# Patient Record
Sex: Male | Born: 1980 | Race: White | Hispanic: No | Marital: Single | Smoking: Never smoker
Health system: Southern US, Community
[De-identification: ages and names within clinical notes are randomized; demographics above are authoritative.]

## PROBLEM LIST (undated history)

## (undated) DIAGNOSIS — R0609 Other forms of dyspnea: Secondary | ICD-10-CM

## (undated) DIAGNOSIS — E785 Hyperlipidemia, unspecified: Secondary | ICD-10-CM

## (undated) DIAGNOSIS — R0600 Dyspnea, unspecified: Secondary | ICD-10-CM

## (undated) DIAGNOSIS — N3281 Overactive bladder: Secondary | ICD-10-CM

## (undated) DIAGNOSIS — I10 Essential (primary) hypertension: Secondary | ICD-10-CM

## (undated) DIAGNOSIS — K219 Gastro-esophageal reflux disease without esophagitis: Secondary | ICD-10-CM

## (undated) DIAGNOSIS — R0789 Other chest pain: Secondary | ICD-10-CM

## (undated) HISTORY — DX: Gastro-esophageal reflux disease without esophagitis: K21.9

## (undated) HISTORY — DX: Hyperlipidemia, unspecified: E78.5

## (undated) HISTORY — DX: Overactive bladder: N32.81

## (undated) HISTORY — DX: Dyspnea, unspecified: R06.00

## (undated) HISTORY — DX: Other chest pain: R07.89

## (undated) HISTORY — DX: Other forms of dyspnea: R06.09

## (undated) HISTORY — DX: Morbid (severe) obesity due to excess calories: E66.01

---

## 2008-05-23 ENCOUNTER — Emergency Department: Payer: Self-pay | Admitting: Emergency Medicine

## 2012-08-27 ENCOUNTER — Encounter: Payer: Self-pay | Admitting: Internal Medicine

## 2012-08-27 ENCOUNTER — Ambulatory Visit (INDEPENDENT_AMBULATORY_CARE_PROVIDER_SITE_OTHER): Payer: BC Managed Care – PPO | Admitting: Internal Medicine

## 2012-08-27 VITALS — BP 125/78 | HR 91 | Temp 98.3°F | Ht 72.0 in | Wt 273.2 lb

## 2012-08-27 DIAGNOSIS — R5381 Other malaise: Secondary | ICD-10-CM

## 2012-08-27 DIAGNOSIS — N318 Other neuromuscular dysfunction of bladder: Secondary | ICD-10-CM

## 2012-08-27 DIAGNOSIS — N3281 Overactive bladder: Secondary | ICD-10-CM

## 2012-08-27 DIAGNOSIS — R5383 Other fatigue: Secondary | ICD-10-CM

## 2012-08-27 DIAGNOSIS — Z139 Encounter for screening, unspecified: Secondary | ICD-10-CM

## 2012-08-29 ENCOUNTER — Encounter: Payer: Self-pay | Admitting: Internal Medicine

## 2012-08-29 NOTE — Assessment & Plan Note (Signed)
Not an issue for him now.  Resolved.

## 2012-08-29 NOTE — Progress Notes (Signed)
  Subjective:    Patient ID: Gary Fleming, male    DOB: October 16, 1980, 32 y.o.   MRN: [id number]  HPI 32 year old male with past history of overactive bladder.  He comes in today to establish care.  States he is doing well.  No cardiac symptoms with increased activity or exertion.  Breathing stable.  No acid reflux.  No nausea or vomiting.  No bowel change.  States the problem with overactive bladder resolved after being treated with two long courses of abx.  Not an issue for him now.  Exercises regularly.  Has not been exercising over the last month because he has been working two jobs.  Plans to start back exercising regularly now.    Past Medical History  Diagnosis Date  . Overactive bladder   . GERD (gastroesophageal reflux disease)     Review of Systems Patient denies any headache, lightheadedness or dizziness.  No sinus or allergy symptoms.  No chest pain, tightness or palpitations.  No increased shortness of breath, cough or congestion.  No nausea or vomiting.  No abdominal pain or cramping.  No bowel change, such as diarrhea, constipation, BRBPR or melana.  No urine change.  Overall he feels he is doing well.  Is sexually active.  Does not use protection.  Has one long term sexual partner.       Objective:   Physical Exam Filed Vitals:   08/27/12 1608  BP: 125/78  Pulse: 91  Temp: 98.3 F (106.91 C)   32 year old male in no acute distress.  HEENT:  Nares - clear.  Oropharynx - without lesions. NECK:  Supple.  Nontender.  No audible carotid bruit.  HEART:  Appears to be regular.   LUNGS:  No crackles or wheezing audible.  Respirations even and unlabored.   RADIAL PULSE:  Equal bilaterally.  ABDOMEN:  Soft.  Nontender.  Bowel sounds present and normal.  No audible abdominal bruit.  GU:  He declined.    RECTAL:  He declined.    EXTREMITIES:  No increased edema present.  DP pulses palpable and equal bilaterally.          Assessment & Plan:  FATIGUE.  Did report some fatigue, but  has been working two jobs.  Will check cbc, met c and tsh with next labs.    QUESTION OF BORDERLINE HYPERCHOLESTEROLEMIA.  Low cholesterol diet.  Check lipid panel with next labs.    HEALTH MAINTENANCE.  Physical today.  He declined GU exam.  States he has had multiple exams through urology.  Check cholesterol.

## 2012-10-29 ENCOUNTER — Other Ambulatory Visit: Payer: BC Managed Care – PPO

## 2012-11-02 ENCOUNTER — Other Ambulatory Visit: Payer: BC Managed Care – PPO

## 2013-08-03 ENCOUNTER — Telehealth: Payer: Self-pay | Admitting: Internal Medicine

## 2013-08-03 NOTE — Telephone Encounter (Signed)
Pt notified that we only have one physician in the office today. We could usually offer an appt with Raquel, NP however she is out of town this week. I recommended that patient go to Acute Care/ Urgent Care/ER & follow-up with [place] afterwards to make sure his sx's clear up.

## 2013-08-03 NOTE — Telephone Encounter (Signed)
Pt is calling and seeing if he could be seen for chest congestion and coughing a lot. May have bronchitis ?? There are no appoinment slots open ?? Offered this afternoon with Dr. Darrick Huntsman but he works a second job and could not do that.

## 2013-08-03 NOTE — Telephone Encounter (Signed)
Noted.  Let me know if we need to do something else.

## 2013-08-04 NOTE — Telephone Encounter (Signed)
Left message for pt to return my call, to confirm he was seen yesterday for his symptoms.

## 2013-08-16 NOTE — Telephone Encounter (Signed)
Pt never returned call

## 2013-09-02 ENCOUNTER — Encounter (INDEPENDENT_AMBULATORY_CARE_PROVIDER_SITE_OTHER): Payer: Self-pay

## 2013-09-02 ENCOUNTER — Encounter: Payer: Self-pay | Admitting: Internal Medicine

## 2013-09-02 ENCOUNTER — Ambulatory Visit (INDEPENDENT_AMBULATORY_CARE_PROVIDER_SITE_OTHER): Payer: BC Managed Care – PPO | Admitting: Internal Medicine

## 2013-09-02 VITALS — BP 120/82 | HR 93 | Temp 98.4°F | Ht 72.0 in | Wt 288.2 lb

## 2013-09-02 DIAGNOSIS — R198 Other specified symptoms and signs involving the digestive system and abdomen: Secondary | ICD-10-CM

## 2013-09-02 DIAGNOSIS — N3281 Overactive bladder: Secondary | ICD-10-CM

## 2013-09-02 DIAGNOSIS — N318 Other neuromuscular dysfunction of bladder: Secondary | ICD-10-CM

## 2013-09-02 MED ORDER — FLUTICASONE PROPIONATE 50 MCG/ACT NA SUSP: 2.0000 | Freq: Every day | NASAL | Status: DC

## 2013-09-02 NOTE — Patient Instructions (Signed)
Saline nasal flushes - flush nose at least 2-3x/day.  Flonase nasal spray - two sprays each nostril one time per day (do this in the evening).

## 2013-09-02 NOTE — Progress Notes (Signed)
  Subjective:    Patient ID: Gary Fleming, male    DOB: 1981-07-15, 33 y.o.   MRN: [id number]  HPI 33 year old male with past history of overactive bladder.  He comes in today for a complete physical exam.  States he is doing well.  No cardiac symptoms with increased activity or exertion.  Breathing stable.  No acid reflux.  No nausea or vomiting.  States the problem with overactive bladder resolved after being treated with two long courses of abx.  Not an issue for him now.  Does see urology yearly.  Exercises regularly.  Does report some oozing of his bowels.  Occurs after having a bowel movement.  Has control of his bowel, but will notice some oozing after completion of a bowel movement.     Past Medical History  Diagnosis Date  . Overactive bladder   . GERD (gastroesophageal reflux disease)     Current Outpatient Prescriptions on File Prior to Visit  Medication Sig Dispense Refill  . Multiple Vitamin (MULTIVITAMIN) tablet Take 1 tablet by mouth daily.       No current facility-administered medications on file prior to visit.    Review of Systems Patient denies any headache, lightheadedness or dizziness.  No sinus or allergy symptoms.  No chest pain, tightness or palpitations.  No increased shortness of breath, cough or congestion.  No nausea or vomiting.  No abdominal pain or cramping.  No bowel change, such as diarrhea, constipation, BRBPR or melana.  Does report the oozing as outlined.  No urine change.  Overall he feels he is doing well.  Has tried fiber supplements and a probiotic.  No improvement in the oozing.       Objective:   Physical Exam  Filed Vitals:   09/02/13 1535  BP: 120/82  Pulse: 93  Temp: 98.4 F (9236.489 C)   33 year old male in no acute distress.  HEENT:  Nares - clear.  Oropharynx - without lesions. NECK:  Supple.  Nontender.  No audible carotid bruit.  HEART:  Appears to be regular.   LUNGS:  No crackles or wheezing audible.  Respirations even and  unlabored.   RADIAL PULSE:  Equal bilaterally.  ABDOMEN:  Soft.  Nontender.  Bowel sounds present and normal.  No audible abdominal bruit.   GU:  Normal sphincter tone.  Heme negative.   EXTREMITIES:  No increased edema present.  DP pulses palpable and equal bilaterally.          Assessment & Plan:  QUESTION OF BORDERLINE HYPERCHOLESTEROLEMIA.  Low cholesterol diet.  Check lipid panel with next labs.    HEALTH MAINTENANCE.  Physical today.   Check cholesterol.

## 2013-09-02 NOTE — Progress Notes (Signed)
Pre-visit discussion using our clinic review tool. No additional management support is needed unless otherwise documented below in the visit note.  

## 2013-09-05 ENCOUNTER — Encounter: Payer: Self-pay | Admitting: Internal Medicine

## 2013-09-05 ENCOUNTER — Encounter: Payer: Self-pay | Admitting: Emergency Medicine

## 2013-09-05 DIAGNOSIS — R197 Diarrhea, unspecified: Secondary | ICD-10-CM | POA: Insufficient documentation

## 2013-09-05 NOTE — Assessment & Plan Note (Signed)
Some oozing after a bowel movement as outlined.  Has had colonoscopy two years ago.  Normal sphincter tone on exam.  Has tried fiber and a probiotic.  No change.  Will refer to GI given change in bowels - present for one year now.

## 2013-09-05 NOTE — Assessment & Plan Note (Signed)
Not an issue for him now.  Resolved.  Sees urology.

## 2013-09-13 ENCOUNTER — Telehealth: Payer: Self-pay | Admitting: *Deleted

## 2013-09-13 DIAGNOSIS — N3281 Overactive bladder: Secondary | ICD-10-CM

## 2013-09-13 DIAGNOSIS — Z13220 Encounter for screening for lipoid disorders: Secondary | ICD-10-CM

## 2013-09-13 DIAGNOSIS — R198 Other specified symptoms and signs involving the digestive system and abdomen: Secondary | ICD-10-CM

## 2013-09-13 NOTE — Telephone Encounter (Signed)
Orders placed for labs

## 2013-09-13 NOTE — Telephone Encounter (Signed)
Pt is coming in for labs tomorrow 01.21.2015 what labs and dx?  

## 2013-09-14 ENCOUNTER — Other Ambulatory Visit: Payer: Self-pay | Admitting: Internal Medicine

## 2013-09-14 ENCOUNTER — Other Ambulatory Visit (INDEPENDENT_AMBULATORY_CARE_PROVIDER_SITE_OTHER): Payer: BC Managed Care – PPO

## 2013-09-14 DIAGNOSIS — Z13220 Encounter for screening for lipoid disorders: Secondary | ICD-10-CM

## 2013-09-14 DIAGNOSIS — E7800 Pure hypercholesterolemia, unspecified: Secondary | ICD-10-CM

## 2013-09-14 DIAGNOSIS — R198 Other specified symptoms and signs involving the digestive system and abdomen: Secondary | ICD-10-CM

## 2013-09-14 LAB — COMPREHENSIVE METABOLIC PANEL
ALBUMIN: 4.1 g/dL (ref 3.5–5.2)
ALT: 34 U/L (ref 0–53)
AST: 27 U/L (ref 0–37)
Alkaline Phosphatase: 80 U/L (ref 39–117)
BUN: 12 mg/dL (ref 6–23)
CO2: 29 mEq/L (ref 19–32)
Calcium: 9 mg/dL (ref 8.4–10.5)
Chloride: 104 mEq/L (ref 96–112)
Creatinine, Ser: 1 mg/dL (ref 0.4–1.5)
GFR: 88.77 mL/min (ref >60.00)
Glucose, Bld: 78 mg/dL (ref 70–99)
POTASSIUM: 3.8 meq/L (ref 3.5–5.1)
Sodium: 138 mEq/L (ref 135–145)
Total Bilirubin: 0.9 mg/dL (ref 0.3–1.2)
Total Protein: 7.4 g/dL (ref 6.0–8.3)

## 2013-09-14 LAB — CBC WITH DIFFERENTIAL/PLATELET
Basophils Absolute: 0 10*3/uL (ref 0.0–0.1)
Basophils Relative: 0.3 % (ref 0.0–3.0)
Eosinophils Absolute: 0.4 10*3/uL (ref 0.0–0.7)
Eosinophils Relative: 3.5 % (ref 0.0–5.0)
HCT: 42.4 % (ref 39.0–52.0)
Hemoglobin: 14.5 g/dL (ref 13.0–17.0)
Lymphocytes Relative: 27 % (ref 12.0–46.0)
Lymphs Abs: 2.8 10*3/uL (ref 0.7–4.0)
MCHC: 34.2 g/dL (ref 30.0–36.0)
MCV: 84.1 fl (ref 78.0–100.0)
MONO ABS: 0.8 10*3/uL (ref 0.1–1.0)
Monocytes Relative: 7.3 % (ref 3.0–12.0)
Neutro Abs: 6.4 10*3/uL (ref 1.4–7.7)
Neutrophils Relative %: 61.9 % (ref 43.0–77.0)
Platelets: 380 10*3/uL (ref 150.0–400.0)
RBC: 5.04 Mil/uL (ref 4.22–5.81)
RDW: 13.4 % (ref 11.5–14.6)
WBC: 10.3 10*3/uL (ref 4.5–10.5)

## 2013-09-14 LAB — LIPID PANEL
CHOL/HDL RATIO: 5
Cholesterol: 215 mg/dL — ABNORMAL HIGH (ref 0–200)
HDL: 39.5 mg/dL (ref >39.00)
Triglycerides: 115 mg/dL (ref 0.0–149.0)
VLDL: 23 mg/dL (ref 0.0–40.0)

## 2013-09-14 LAB — TSH: TSH: 1.35 u[IU]/mL (ref 0.35–5.50)

## 2013-09-14 LAB — LDL CHOLESTEROL, DIRECT: Direct LDL: 161 mg/dL

## 2013-09-14 NOTE — Progress Notes (Signed)
Order placed for cholesterol check.  

## 2013-09-15 ENCOUNTER — Encounter: Payer: Self-pay | Admitting: *Deleted

## 2014-03-01 ENCOUNTER — Other Ambulatory Visit (INDEPENDENT_AMBULATORY_CARE_PROVIDER_SITE_OTHER): Payer: BC Managed Care – PPO

## 2014-03-01 ENCOUNTER — Encounter: Payer: Self-pay | Admitting: Internal Medicine

## 2014-03-01 DIAGNOSIS — E7800 Pure hypercholesterolemia, unspecified: Secondary | ICD-10-CM

## 2014-03-01 LAB — LIPID PANEL
Cholesterol: 237 mg/dL — ABNORMAL HIGH (ref 0–200)
HDL: 41.4 mg/dL (ref >39.00)
LDL Cholesterol: 177 mg/dL — ABNORMAL HIGH (ref 0–99)
NonHDL: 195.6
Total CHOL/HDL Ratio: 6
Triglycerides: 94 mg/dL (ref 0.0–149.0)
VLDL: 18.8 mg/dL (ref 0.0–40.0)

## 2014-03-02 ENCOUNTER — Other Ambulatory Visit: Payer: Self-pay | Admitting: Internal Medicine

## 2014-03-02 ENCOUNTER — Other Ambulatory Visit (INDEPENDENT_AMBULATORY_CARE_PROVIDER_SITE_OTHER): Payer: BC Managed Care – PPO

## 2014-03-02 DIAGNOSIS — E7800 Pure hypercholesterolemia, unspecified: Secondary | ICD-10-CM

## 2014-03-02 LAB — HEPATIC FUNCTION PANEL
ALBUMIN: 4.4 g/dL (ref 3.5–5.2)
ALK PHOS: 78 U/L (ref 39–117)
ALT: 40 U/L (ref 0–53)
AST: 28 U/L (ref 0–37)
Bilirubin, Direct: 0.1 mg/dL (ref 0.0–0.3)
TOTAL PROTEIN: 7.5 g/dL (ref 6.0–8.3)
Total Bilirubin: 0.9 mg/dL (ref 0.2–1.2)

## 2014-03-02 NOTE — Progress Notes (Signed)
Order placed for liver panel.  

## 2014-03-03 ENCOUNTER — Ambulatory Visit (INDEPENDENT_AMBULATORY_CARE_PROVIDER_SITE_OTHER): Payer: BC Managed Care – PPO | Admitting: Internal Medicine

## 2014-03-03 ENCOUNTER — Encounter: Payer: Self-pay | Admitting: Internal Medicine

## 2014-03-03 VITALS — BP 130/80 | HR 79 | Temp 98.4°F | Wt 283.0 lb

## 2014-03-03 DIAGNOSIS — E7800 Pure hypercholesterolemia, unspecified: Secondary | ICD-10-CM

## 2014-03-03 DIAGNOSIS — L989 Disorder of the skin and subcutaneous tissue, unspecified: Secondary | ICD-10-CM

## 2014-03-03 DIAGNOSIS — N3281 Overactive bladder: Secondary | ICD-10-CM

## 2014-03-03 DIAGNOSIS — R198 Other specified symptoms and signs involving the digestive system and abdomen: Secondary | ICD-10-CM

## 2014-03-03 DIAGNOSIS — N318 Other neuromuscular dysfunction of bladder: Secondary | ICD-10-CM

## 2014-03-03 NOTE — Progress Notes (Signed)
Pre visit review using our clinic review tool, if applicable. No additional management support is needed unless otherwise documented below in the visit note. 

## 2014-03-09 ENCOUNTER — Encounter: Payer: Self-pay | Admitting: Internal Medicine

## 2014-03-09 DIAGNOSIS — E7800 Pure hypercholesterolemia, unspecified: Secondary | ICD-10-CM | POA: Insufficient documentation

## 2014-03-09 DIAGNOSIS — L989 Disorder of the skin and subcutaneous tissue, unspecified: Secondary | ICD-10-CM | POA: Insufficient documentation

## 2014-03-09 NOTE — Assessment & Plan Note (Signed)
Saw GI.  Diagnosed with IBS with constipation.  Felt no further w/up warranted.  He desires no further intervention.

## 2014-03-09 NOTE — Assessment & Plan Note (Signed)
Not an issue for him now.  Resolved.  Sees urology.

## 2014-03-09 NOTE — Assessment & Plan Note (Signed)
Persistent lesion - abdomen.  Refer to dermatology for evaluation.

## 2014-03-09 NOTE — Assessment & Plan Note (Signed)
Cholesterol just checked and elevated (LDL 177).  Discussed medication.  He declines.  Wants to work on diet and exercise.  Follow.

## 2014-03-09 NOTE — Progress Notes (Signed)
  Subjective:    Patient ID: Gary Fleming, male    DOB: 12/20/1980, 33 y.o.   MRN: [id number]  HPI 33 year old male with past history of overactive bladder.  He comes in today for a scheduled follow up.  States he is doing well.  No cardiac symptoms with increased activity or exertion.  Breathing stable.  No acid reflux.  No nausea or vomiting.  States the problem with overactive bladder resolved after being treated with two long courses of abx.  Not an issue for him now.  Does see urology yearly.  Exercises regularly.  Had reported some oozing of his bowels.  See last note for details.  Saw GI.  No further w/up thought warranted.  Told him had IBS with constipation.  He states he is managing.  Desires no further intervention.  Does have a lesion on his abdomen.  Persistent.     Past Medical History  Diagnosis Date  . Overactive bladder   . GERD (gastroesophageal reflux disease)     Current Outpatient Prescriptions on File Prior to Visit  Medication Sig Dispense Refill  . fluticasone (FLONASE) 50 MCG/ACT nasal spray Place 2 sprays into both nostrils daily.  16 g  2  . Multiple Vitamin (MULTIVITAMIN) tablet Take 1 tablet by mouth daily.       No current facility-administered medications on file prior to visit.    Review of Systems Patient denies any headache, lightheadedness or dizziness.  No sinus or allergy symptoms.  No chest pain, tightness or palpitations.  No increased shortness of breath, cough or congestion.  No nausea or vomiting.  No abdominal pain or cramping.  No bowel change, such as diarrhea, constipation, BRBPR or melana.  Does report the oozing as outlined.  Saw GI.  See above.  No urine change.  Overall he feels he is doing well.       Objective:   Physical Exam  Filed Vitals:   03/03/14 1622  BP: 130/80  Pulse: 79  Temp: 98.4 F (9036.759 C)   33 year old male in no acute distress.  HEENT:  Nares - clear.  Oropharynx - without lesions. NECK:  Supple.  Nontender.  No  audible carotid bruit.  HEART:  Appears to be regular.   LUNGS:  No crackles or wheezing audible.  Respirations even and unlabored.   RADIAL PULSE:  Equal bilaterally.  ABDOMEN:  Soft.  Nontender.  Bowel sounds present and normal.  No audible abdominal bruit.  EXTREMITIES:  No increased edema present.  DP pulses palpable and equal bilaterally.          Assessment & Plan:  HEALTH MAINTENANCE.  Physical last visit (09/02/13).   Sees urology yearly.

## 2014-09-01 ENCOUNTER — Other Ambulatory Visit: Payer: BC Managed Care – PPO

## 2014-09-04 ENCOUNTER — Encounter: Payer: BC Managed Care – PPO | Admitting: Internal Medicine

## 2014-11-06 ENCOUNTER — Other Ambulatory Visit (INDEPENDENT_AMBULATORY_CARE_PROVIDER_SITE_OTHER): Payer: BC Managed Care – PPO

## 2014-11-06 DIAGNOSIS — E7800 Pure hypercholesterolemia, unspecified: Secondary | ICD-10-CM

## 2014-11-06 DIAGNOSIS — R194 Change in bowel habit: Secondary | ICD-10-CM

## 2014-11-06 DIAGNOSIS — R198 Other specified symptoms and signs involving the digestive system and abdomen: Secondary | ICD-10-CM

## 2014-11-06 LAB — COMPREHENSIVE METABOLIC PANEL
ALBUMIN: 4.6 g/dL (ref 3.5–5.2)
ALT: 32 U/L (ref 0–53)
AST: 25 U/L (ref 0–37)
Alkaline Phosphatase: 91 U/L (ref 39–117)
BUN: 10 mg/dL (ref 6–23)
CALCIUM: 9.4 mg/dL (ref 8.4–10.5)
CO2: 30 meq/L (ref 19–32)
Chloride: 101 mEq/L (ref 96–112)
Creatinine, Ser: 0.92 mg/dL (ref 0.40–1.50)
GFR: 100.41 mL/min (ref >60.00)
GLUCOSE: 80 mg/dL (ref 70–99)
Potassium: 3.8 mEq/L (ref 3.5–5.1)
Sodium: 139 mEq/L (ref 135–145)
Total Bilirubin: 0.4 mg/dL (ref 0.2–1.2)
Total Protein: 7.6 g/dL (ref 6.0–8.3)

## 2014-11-06 LAB — LIPID PANEL
CHOLESTEROL: 222 mg/dL — AB (ref 0–200)
HDL: 44.1 mg/dL (ref >39.00)
NonHDL: 177.9
Total CHOL/HDL Ratio: 5
Triglycerides: 207 mg/dL — ABNORMAL HIGH (ref 0.0–149.0)
VLDL: 41.4 mg/dL — ABNORMAL HIGH (ref 0.0–40.0)

## 2014-11-06 LAB — CBC WITH DIFFERENTIAL/PLATELET
BASOS ABS: 0.1 10*3/uL (ref 0.0–0.1)
Basophils Relative: 0.6 % (ref 0.0–3.0)
EOS ABS: 0.6 10*3/uL (ref 0.0–0.7)
Eosinophils Relative: 6 % — ABNORMAL HIGH (ref 0.0–5.0)
HCT: 45.8 % (ref 39.0–52.0)
HEMOGLOBIN: 15.6 g/dL (ref 13.0–17.0)
LYMPHS PCT: 34.4 % (ref 12.0–46.0)
Lymphs Abs: 3.2 10*3/uL (ref 0.7–4.0)
MCHC: 34 g/dL (ref 30.0–36.0)
MCV: 84.4 fl (ref 78.0–100.0)
MONOS PCT: 7.5 % (ref 3.0–12.0)
Monocytes Absolute: 0.7 10*3/uL (ref 0.1–1.0)
NEUTROS PCT: 51.5 % (ref 43.0–77.0)
Neutro Abs: 4.8 10*3/uL (ref 1.4–7.7)
Platelets: 365 10*3/uL (ref 150.0–400.0)
RBC: 5.42 Mil/uL (ref 4.22–5.81)
RDW: 13.1 % (ref 11.5–15.5)
WBC: 9.3 10*3/uL (ref 4.0–10.5)

## 2014-11-06 LAB — LDL CHOLESTEROL, DIRECT: Direct LDL: 159 mg/dL

## 2014-11-06 LAB — TSH: TSH: 3.08 u[IU]/mL (ref 0.35–4.50)

## 2014-11-07 ENCOUNTER — Encounter: Payer: Self-pay | Admitting: Internal Medicine

## 2014-11-09 ENCOUNTER — Encounter: Payer: BC Managed Care – PPO | Admitting: Internal Medicine

## 2014-12-19 ENCOUNTER — Other Ambulatory Visit (HOSPITAL_COMMUNITY)
Admission: RE | Admit: 2014-12-19 | Discharge: 2014-12-19 | Disposition: A | Discharge status: Home / Self Care | Payer: BC Managed Care – PPO | Source: Ambulatory Visit | Attending: Internal Medicine | Admitting: Internal Medicine

## 2014-12-19 ENCOUNTER — Encounter: Payer: Self-pay | Admitting: Internal Medicine

## 2014-12-19 ENCOUNTER — Ambulatory Visit (INDEPENDENT_AMBULATORY_CARE_PROVIDER_SITE_OTHER): Payer: BC Managed Care – PPO | Admitting: Internal Medicine

## 2014-12-19 VITALS — BP 138/78 | HR 105 | Temp 98.2°F | Ht 71.5 in | Wt 283.5 lb

## 2014-12-19 DIAGNOSIS — Z202 Contact with and (suspected) exposure to infections with a predominantly sexual mode of transmission: Secondary | ICD-10-CM | POA: Diagnosis not present

## 2014-12-19 DIAGNOSIS — Z113 Encounter for screening for infections with a predominantly sexual mode of transmission: Secondary | ICD-10-CM | POA: Diagnosis not present

## 2014-12-19 DIAGNOSIS — Z0000 Encounter for general adult medical examination without abnormal findings: Secondary | ICD-10-CM | POA: Diagnosis not present

## 2014-12-19 DIAGNOSIS — N3281 Overactive bladder: Secondary | ICD-10-CM

## 2014-12-19 DIAGNOSIS — E7800 Pure hypercholesterolemia, unspecified: Secondary | ICD-10-CM

## 2014-12-19 DIAGNOSIS — G479 Sleep disorder, unspecified: Secondary | ICD-10-CM | POA: Diagnosis not present

## 2014-12-19 DIAGNOSIS — Z711 Person with feared health complaint in whom no diagnosis is made: Secondary | ICD-10-CM

## 2014-12-19 MED ORDER — TRAZODONE HCL 50 MG PO TABS: 25.0000 mg | ORAL_TABLET | Freq: Every evening | ORAL | Status: DC | PRN

## 2014-12-19 MED ORDER — ATORVASTATIN CALCIUM 10 MG PO TABS: 10.0000 mg | ORAL_TABLET | Freq: Every day | ORAL | Status: DC

## 2014-12-19 NOTE — Progress Notes (Signed)
Patient ID: Gary Fleming, male   DOB: 1980/12/02, 34 y.o.   MRN: [id number]   Subjective:    Patient ID: Gary Fleming, male    DOB: 1980/12/02, 34 y.o.   MRN: [id number]  HPI  Patient here for a physical exam.  Has seen urology.  Had prostate check through there office.  On lipitor.  Discussed diet and exercise.  Tries to stay active.  No cardiac symptoms with increased activity or exertion.  Breathing stable.  Has trouble sleeping.  Has tried otc sleep aids and melatonin.  Would like to try prescription medication.  Also had unprotected sex recently.  Wants checked for STDs.  No symptoms.  No dysuria.  No discharge.     Past Medical History  Diagnosis Date  . Overactive bladder   . GERD (gastroesophageal reflux disease)     Review of Systems  Constitutional: Negative for appetite change and unexpected weight change.  HENT: Negative for congestion and sinus pressure.   Eyes: Negative for pain and visual disturbance.  Respiratory: Negative for cough, chest tightness and shortness of breath.   Cardiovascular: Negative for chest pain, palpitations and leg swelling.  Gastrointestinal: Negative for nausea, vomiting, abdominal pain and diarrhea.  Genitourinary: Negative for dysuria and difficulty urinating.  Musculoskeletal: Negative for back pain and joint swelling.  Skin: Negative for color change and rash.  Neurological: Negative for dizziness, light-headedness and headaches.  Hematological: Negative for adenopathy. Does not bruise/bleed easily.  Psychiatric/Behavioral: Negative for dysphoric mood and agitation.       Objective:     Blood pressure recheck:  128/78, pulse 84  Physical Exam  Constitutional: He is oriented to person, place, and time. He appears well-developed and well-nourished. No distress.  HENT:  Head: Normocephalic and atraumatic.  Nose: Nose normal.  Mouth/Throat: Oropharynx is clear and moist. No oropharyngeal exudate.  Eyes: Conjunctivae are normal.  Right eye exhibits no discharge. Left eye exhibits no discharge.  Neck: Neck supple. No thyromegaly present.  Cardiovascular: Normal rate and regular rhythm.   Pulmonary/Chest: Breath sounds normal. No respiratory distress. He has no wheezes.  Abdominal: Soft. Bowel sounds are normal. There is no tenderness.  Genitourinary:  Performed by urology  Musculoskeletal: He exhibits no edema or tenderness.  Lymphadenopathy:    He has no cervical adenopathy.  Neurological: He is alert and oriented to person, place, and time.  Skin: Skin is warm and dry. No rash noted.  Psychiatric: He has a normal mood and affect. His behavior is normal.    BP 138/78 mmHg  Pulse 105  Temp(Src) 98.2 F (36.8 C) (Oral)  Ht 5' 11.5" (1.816 m)  Wt 283 lb 8 oz (128.595 kg)  BMI 38.99 kg/m2  SpO2 99% Wt Readings from Last 3 Encounters:  12/19/14 283 lb 8 oz (128.595 kg)  03/03/14 283 lb (128.368 kg)  09/02/13 288 lb 4 oz (130.749 kg)     Lab Results  Component Value Date   WBC 9.3 11/06/2014   HGB 15.6 11/06/2014   HCT 45.8 11/06/2014   PLT 365.0 11/06/2014   GLUCOSE 80 11/06/2014   CHOL 222* 11/06/2014   TRIG 207.0* 11/06/2014   HDL 44.10 11/06/2014   LDLDIRECT 159.0 11/06/2014   LDLCALC 177* 03/01/2014   ALT 32 11/06/2014   AST 25 11/06/2014   NA 139 11/06/2014   K 3.8 11/06/2014   CL 101 11/06/2014   CREATININE 0.92 11/06/2014   BUN 10 11/06/2014   CO2 30 11/06/2014   TSH  3.08 11/06/2014       Assessment & Plan:   Problem List Items Addressed This Visit    Difficulty sleeping    Has tried otc sleep aids.  Tried melatonin.  Discussed at length with him today.  No history of sleep apnea.  No increased daytime somnolence.  Does feel rested if gets a good nights sleep.  Start trazodone as directed.  Follow.        Health care maintenance    Physical 12/19/14.  Sees urology for prostate checks.        Hypercholesterolemia    Low cholesterol diet and exercise.  On lipitor.  Follow lipid  apnela dn liver function tests.        Relevant Medications   atorvastatin (LIPITOR) 10 MG tablet   Overactive bladder    Sees urology.  No problems reported.        STD exposure    Discussed with pt at length.  Had unprotected sex.  Check STD panel, GC, chlamydia, trichomonas and HSV.         Other Visit Diagnoses    Concern about STD in male without diagnosis    -  Primary    Relevant Orders    STD Panel (HBSAG,HIV,RPR) (Completed)    HSV 1 antibody, IgG (Completed)    Urine cytology ancillary only (Completed)      I spent 25 minutes with the patient and more than 50% of the time was spent in consultation regarding the above.     Dale [place], MD

## 2014-12-19 NOTE — Progress Notes (Signed)
Pre visit review using our clinic review tool, if applicable. No additional management support is needed unless otherwise documented below in the visit note. 

## 2014-12-20 ENCOUNTER — Encounter: Payer: Self-pay | Admitting: Internal Medicine

## 2014-12-20 LAB — STD PANEL
HIV 1&2 Ab, 4th Generation: NONREACTIVE
Hepatitis B Surface Ag: NEGATIVE

## 2014-12-20 LAB — HSV 1 ANTIBODY, IGG: HSV 1 Glycoprotein G Ab, IgG: <0.1 IV

## 2014-12-21 ENCOUNTER — Encounter: Payer: Self-pay | Admitting: Internal Medicine

## 2014-12-21 LAB — URINE CYTOLOGY ANCILLARY ONLY
Chlamydia: NEGATIVE
Neisseria Gonorrhea: NEGATIVE
Trichomonas: NEGATIVE

## 2014-12-26 ENCOUNTER — Encounter: Payer: Self-pay | Admitting: Internal Medicine

## 2014-12-26 DIAGNOSIS — Z0000 Encounter for general adult medical examination without abnormal findings: Secondary | ICD-10-CM | POA: Insufficient documentation

## 2014-12-26 DIAGNOSIS — G479 Sleep disorder, unspecified: Secondary | ICD-10-CM | POA: Insufficient documentation

## 2014-12-26 DIAGNOSIS — Z202 Contact with and (suspected) exposure to infections with a predominantly sexual mode of transmission: Secondary | ICD-10-CM | POA: Insufficient documentation

## 2014-12-26 NOTE — Assessment & Plan Note (Signed)
Discussed with pt at length.  Had unprotected sex.  Check STD panel, GC, chlamydia, trichomonas and HSV.

## 2014-12-26 NOTE — Assessment & Plan Note (Signed)
Physical 12/19/14.  Sees urology for prostate checks.   

## 2014-12-26 NOTE — Assessment & Plan Note (Signed)
Low cholesterol diet and exercise.  On lipitor.  Follow lipid apnela dn liver function tests.

## 2014-12-26 NOTE — Assessment & Plan Note (Signed)
Has tried otc sleep aids.  Tried melatonin.  Discussed at length with him today.  No history of sleep apnea.  No increased daytime somnolence.  Does feel rested if gets a good nights sleep.  Start trazodone as directed.  Follow.

## 2014-12-26 NOTE — Assessment & Plan Note (Signed)
Sees urology.  No problems reported.   

## 2015-01-30 ENCOUNTER — Other Ambulatory Visit: Payer: BC Managed Care – PPO

## 2015-02-20 ENCOUNTER — Other Ambulatory Visit (INDEPENDENT_AMBULATORY_CARE_PROVIDER_SITE_OTHER): Payer: BC Managed Care – PPO

## 2015-02-20 ENCOUNTER — Encounter: Payer: Self-pay | Admitting: Internal Medicine

## 2015-02-20 ENCOUNTER — Telehealth: Payer: Self-pay | Admitting: *Deleted

## 2015-02-20 DIAGNOSIS — E7800 Pure hypercholesterolemia, unspecified: Secondary | ICD-10-CM

## 2015-02-20 LAB — HEPATIC FUNCTION PANEL
ALT: 31 U/L (ref 0–53)
AST: 23 U/L (ref 0–37)
Albumin: 4.4 g/dL (ref 3.5–5.2)
Alkaline Phosphatase: 86 U/L (ref 39–117)
Bilirubin, Direct: 0.2 mg/dL (ref 0.0–0.3)
Total Bilirubin: 0.8 mg/dL (ref 0.2–1.2)
Total Protein: 7.1 g/dL (ref 6.0–8.3)

## 2015-02-20 NOTE — Telephone Encounter (Signed)
Order placed for labs.

## 2015-02-20 NOTE — Telephone Encounter (Signed)
Labs and dx?  

## 2015-03-07 ENCOUNTER — Other Ambulatory Visit: Payer: Self-pay | Admitting: *Deleted

## 2015-03-07 MED ORDER — ATORVASTATIN CALCIUM 10 MG PO TABS: 10.0000 mg | ORAL_TABLET | Freq: Every day | ORAL | Status: DC

## 2015-03-13 ENCOUNTER — Ambulatory Visit (INDEPENDENT_AMBULATORY_CARE_PROVIDER_SITE_OTHER): Payer: BC Managed Care – PPO | Admitting: Internal Medicine

## 2015-03-13 ENCOUNTER — Encounter: Payer: Self-pay | Admitting: Internal Medicine

## 2015-03-13 VITALS — BP 134/85 | HR 87 | Temp 98.3°F | Resp 18 | Ht 71.5 in | Wt 291.2 lb

## 2015-03-13 DIAGNOSIS — Z0000 Encounter for general adult medical examination without abnormal findings: Secondary | ICD-10-CM | POA: Diagnosis not present

## 2015-03-13 DIAGNOSIS — N3281 Overactive bladder: Secondary | ICD-10-CM | POA: Diagnosis not present

## 2015-03-13 DIAGNOSIS — E7800 Pure hypercholesterolemia, unspecified: Secondary | ICD-10-CM

## 2015-03-13 DIAGNOSIS — R194 Change in bowel habit: Secondary | ICD-10-CM

## 2015-03-13 DIAGNOSIS — G479 Sleep disorder, unspecified: Secondary | ICD-10-CM

## 2015-03-13 DIAGNOSIS — R198 Other specified symptoms and signs involving the digestive system and abdomen: Secondary | ICD-10-CM

## 2015-03-13 NOTE — Progress Notes (Signed)
Patient ID: Gary MacMatthew Y Basinski, male   DOB: [birth year], 34 y.o.   MRN: [id number]   Subjective:    Patient ID: Gary Fleming, male    DOB: [birth year], 34 y.o.   MRN: [id number]  HPI  Patient here for a scheduled follow up.  Reports not taking the trazodone now.  Took only for a few nights.  States he slept for a couple of hours and then woke up.  We discussed the possibility of taking an increased dose.  He is taking an otc sleep aid.  States this is working well for him and he desires not to change.  No breathing difficulty.  Does feel rested if he sleeps well.  No increased daytime somnolence.  We discussed diet and exercise.  Bowels stable.  No urinary symptoms.     Past Medical History  Diagnosis Date  . Overactive bladder   . GERD (gastroesophageal reflux disease)     Outpatient Encounter Prescriptions as of 03/13/2015  Medication Sig  . atorvastatin (LIPITOR) 10 MG tablet Take 1 tablet (10 mg total) by mouth daily.  . fluticasone (FLONASE) 50 MCG/ACT nasal spray Place 2 sprays into both nostrils daily.  . Multiple Vitamin (MULTIVITAMIN) tablet Take 1 tablet by mouth daily.  . traZODone (DESYREL) 50 MG tablet Take 0.5-1 tablets (25-50 mg total) by mouth at bedtime as needed for sleep. (Patient not taking: Reported on 03/13/2015)   No facility-administered encounter medications on file as of 03/13/2015.    Review of Systems  Constitutional: Negative for appetite change and unexpected weight change.  HENT: Negative for congestion and sinus pressure.   Respiratory: Negative for cough, chest tightness and shortness of breath.   Cardiovascular: Negative for chest pain, palpitations and leg swelling.  Gastrointestinal: Negative for nausea, vomiting, abdominal pain and diarrhea.  Genitourinary: Negative for dysuria and difficulty urinating.  Skin: Negative for color change and rash.  Neurological: Negative for dizziness, light-headedness and headaches.  Psychiatric/Behavioral: Negative  for dysphoric mood and agitation.       Objective:     Blood pressure recheck:  128/84, pulse 84  Physical Exam  Constitutional: He appears well-developed and well-nourished. No distress.  HENT:  Nose: Nose normal.  Mouth/Throat: Oropharynx is clear and moist.  Neck: Neck supple. No thyromegaly present.  Cardiovascular: Normal rate and regular rhythm.   Pulmonary/Chest: Effort normal and breath sounds normal. No respiratory distress.  Abdominal: Soft. Bowel sounds are normal. There is no tenderness.  Musculoskeletal: He exhibits no edema or tenderness.  Lymphadenopathy:    He has no cervical adenopathy.  Skin: No rash noted. No erythema.  Psychiatric: He has a normal mood and affect. His behavior is normal.    BP 134/85 mmHg  Pulse 87  Temp(Src) 98.3 F (36.8 C) (Oral)  Resp 18  Ht 5' 11.5" (1.816 m)  Wt 291 lb 4 oz (132.11 kg)  BMI 40.06 kg/m2  SpO2 99% Wt Readings from Last 3 Encounters:  03/13/15 291 lb 4 oz (132.11 kg)  12/19/14 283 lb 8 oz (128.595 kg)  03/03/14 283 lb (128.368 kg)     Lab Results  Component Value Date   WBC 9.3 11/06/2014   HGB 15.6 11/06/2014   HCT 45.8 11/06/2014   PLT 365.0 11/06/2014   GLUCOSE 80 11/06/2014   CHOL 222* 11/06/2014   TRIG 207.0* 11/06/2014   HDL 44.10 11/06/2014   LDLDIRECT 159.0 11/06/2014   LDLCALC 177* 03/01/2014   ALT 31 02/20/2015   AST 23 02/20/2015  NA 139 11/06/2014   K 3.8 11/06/2014   CL 101 11/06/2014   CREATININE 0.92 11/06/2014   BUN 10 11/06/2014   CO2 30 11/06/2014   TSH 3.08 11/06/2014       Assessment & Plan:   Problem List Items Addressed This Visit    Change in bowel function    Not an issue now.  Saw GI.  Diagnosed with IBS with constipation.        Difficulty sleeping    Doing better now.  Desires no further intervention.        Health care maintenance    Physical 12/19/14.  Sees urology for prostate checks.        Hypercholesterolemia    Low cholesterol diet and exercise.   On lipitor.  Follow lipid panel and liver function tests.        Relevant Orders   Lipid panel   Basic metabolic panel   Overactive bladder - Primary    Sees urology.  No problems reported.            Dale [place], MD

## 2015-03-13 NOTE — Progress Notes (Signed)
Pre visit review using our clinic review tool, if applicable. No additional management support is needed unless otherwise documented below in the visit note. 

## 2015-03-14 ENCOUNTER — Encounter: Payer: Self-pay | Admitting: Internal Medicine

## 2015-03-14 NOTE — Assessment & Plan Note (Signed)
Doing better now.  Desires no further intervention.

## 2015-03-14 NOTE — Assessment & Plan Note (Signed)
Sees urology.  No problems reported.

## 2015-03-14 NOTE — Assessment & Plan Note (Signed)
Not an issue now.  Saw GI.  Diagnosed with IBS with constipation.

## 2015-03-14 NOTE — Assessment & Plan Note (Signed)
Low cholesterol diet and exercise.  On lipitor.  Follow lipid panel and liver function tests.   

## 2015-03-14 NOTE — Assessment & Plan Note (Signed)
Physical 12/19/14.  Sees urology for prostate checks.

## 2015-03-30 ENCOUNTER — Other Ambulatory Visit: Payer: BC Managed Care – PPO

## 2015-04-26 ENCOUNTER — Telehealth: Payer: Self-pay | Admitting: *Deleted

## 2015-04-26 ENCOUNTER — Other Ambulatory Visit (INDEPENDENT_AMBULATORY_CARE_PROVIDER_SITE_OTHER): Payer: BC Managed Care – PPO

## 2015-04-26 DIAGNOSIS — E7800 Pure hypercholesterolemia, unspecified: Secondary | ICD-10-CM

## 2015-04-26 LAB — LIPID PANEL
CHOLESTEROL: 171 mg/dL (ref 0–200)
HDL: 40.2 mg/dL (ref >39.00)
LDL CALC: 102 mg/dL — AB (ref 0–99)
NonHDL: 130.38
Total CHOL/HDL Ratio: 4
Triglycerides: 143 mg/dL (ref 0.0–149.0)
VLDL: 28.6 mg/dL (ref 0.0–40.0)

## 2015-04-26 LAB — HEPATIC FUNCTION PANEL
ALBUMIN: 4.5 g/dL (ref 3.5–5.2)
ALT: 43 U/L (ref 0–53)
AST: 25 U/L (ref 0–37)
Alkaline Phosphatase: 94 U/L (ref 39–117)
Bilirubin, Direct: 0.1 mg/dL (ref 0.0–0.3)
Total Bilirubin: 0.6 mg/dL (ref 0.2–1.2)
Total Protein: 7.3 g/dL (ref 6.0–8.3)

## 2015-04-26 LAB — BASIC METABOLIC PANEL
BUN: 13 mg/dL (ref 6–23)
CALCIUM: 9.4 mg/dL (ref 8.4–10.5)
CO2: 29 meq/L (ref 19–32)
CREATININE: 0.97 mg/dL (ref 0.40–1.50)
Chloride: 102 mEq/L (ref 96–112)
GFR: 94.2 mL/min (ref >60.00)
GLUCOSE: 95 mg/dL (ref 70–99)
Potassium: 4.5 mEq/L (ref 3.5–5.1)
Sodium: 139 mEq/L (ref 135–145)

## 2015-04-26 NOTE — Telephone Encounter (Signed)
Labs and dx?  

## 2015-04-26 NOTE — Telephone Encounter (Signed)
Order placed for labs.

## 2015-04-27 ENCOUNTER — Encounter: Payer: Self-pay | Admitting: Internal Medicine

## 2015-07-16 ENCOUNTER — Ambulatory Visit: Payer: BC Managed Care – PPO | Admitting: Internal Medicine

## 2015-07-16 DIAGNOSIS — Z0289 Encounter for other administrative examinations: Secondary | ICD-10-CM

## 2015-10-10 ENCOUNTER — Other Ambulatory Visit: Payer: Self-pay | Admitting: Internal Medicine

## 2015-10-31 ENCOUNTER — Telehealth: Payer: Self-pay

## 2015-10-31 NOTE — Telephone Encounter (Signed)
Pt states that he wanted to inform Dr. Lorin PicketScott that he had a nose bleed in his right nostril  For 10 mins. due to using the flonase. He states that it was not really bad just wanted advise on how to keep it from bleeding again. Please advise, thanks

## 2015-11-01 NOTE — Telephone Encounter (Signed)
Confirm no other symptoms.  If no, then would recommend stopping the flonase (if he hasn't already).  Is some patients, this can sometime cause some irritation.  If persistent problems with nose bleeds, let us know.

## 2015-11-01 NOTE — Telephone Encounter (Signed)
Pt states that he is not having any other issues, notified pt of Dr. Roby LoftsScott's comments. Pt verbalized understanding and appreciation.

## 2016-01-10 ENCOUNTER — Encounter: Payer: Self-pay | Admitting: Internal Medicine

## 2016-01-10 ENCOUNTER — Ambulatory Visit (INDEPENDENT_AMBULATORY_CARE_PROVIDER_SITE_OTHER): Payer: BC Managed Care – PPO | Admitting: Internal Medicine

## 2016-01-10 VITALS — BP 120/80 | HR 104 | Temp 98.7°F | Wt 296.8 lb

## 2016-01-10 DIAGNOSIS — R194 Change in bowel habit: Secondary | ICD-10-CM | POA: Diagnosis not present

## 2016-01-10 DIAGNOSIS — E7800 Pure hypercholesterolemia, unspecified: Secondary | ICD-10-CM

## 2016-01-10 DIAGNOSIS — R198 Other specified symptoms and signs involving the digestive system and abdomen: Secondary | ICD-10-CM

## 2016-01-10 NOTE — Progress Notes (Signed)
Patient ID: LAFE CLERK, male   DOB: Mar 22, 1981, 35 y.o.   MRN: [id number]   Subjective:    Patient ID: Gary Fleming, male    DOB: 1980-11-13, 35 y.o.   MRN: [id number]  HPI  Patient here for a scheduled follow up.  States he is doing well.  Feels good.  States has been exercising.  Does cardio and lifts weights 4 days per week.  No cardiac symptoms with increased activity or exertion.  No sob.  No acid reflux.  No abdominal pain or cramping.  Bowels stable.    Past Medical History  Diagnosis Date  . Overactive bladder   . GERD (gastroesophageal reflux disease)    No past surgical history on file. Family History  Problem Relation Age of Onset  . Colon cancer Neg Hx   . Prostate cancer Paternal Grandfather   . Prostate cancer Paternal Uncle   . Lung cancer Maternal Grandmother   . Heart disease Paternal Grandfather   . Hypertension Mother   . Hypertension Father   . Hypercholesterolemia Mother   . Hypercholesterolemia Father   . Diabetes Mother    Social History   Social History  . Marital Status: Single    Spouse Name: N/A  . Number of Children: N/A  . Years of Education: N/A   Occupational History  .     Social History Main Topics  . Smoking status: Never Smoker   . Smokeless tobacco: Current User    Types: Chew  . Alcohol Use: 0.0 oz/week    0 Standard drinks or equivalent per week     Comment: three to four beers on the weekend  . Drug Use: No  . Sexual Activity: Not Asked   Other Topics Concern  . None   Social History Narrative    Outpatient Encounter Prescriptions as of 01/10/2016  Medication Sig  . atorvastatin (LIPITOR) 10 MG tablet take 1 tablet by mouth once daily  . fluticasone (FLONASE) 50 MCG/ACT nasal spray Place 2 sprays into both nostrils daily.  . Multiple Vitamin (MULTIVITAMIN) tablet Take 1 tablet by mouth daily.  . [DISCONTINUED] traZODone (DESYREL) 50 MG tablet Take 0.5-1 tablets (25-50 mg total) by mouth at bedtime as needed for  sleep.   No facility-administered encounter medications on file as of 01/10/2016.    Review of Systems  Constitutional: Negative for appetite change and unexpected weight change.  HENT: Negative for congestion and sinus pressure.   Respiratory: Negative for cough, chest tightness and shortness of breath.   Cardiovascular: Negative for chest pain, palpitations and leg swelling.  Gastrointestinal: Negative for nausea, vomiting, abdominal pain and diarrhea.  Genitourinary: Negative for dysuria and difficulty urinating.  Musculoskeletal: Negative for back pain and joint swelling.  Skin: Negative for color change and rash.  Neurological: Negative for dizziness, light-headedness and headaches.  Psychiatric/Behavioral: Negative for dysphoric mood and agitation.       Objective:    Physical Exam  Constitutional: He appears well-developed and well-nourished. No distress.  HENT:  Nose: Nose normal.  Mouth/Throat: Oropharynx is clear and moist.  Neck: Neck supple. No thyromegaly present.  Cardiovascular: Normal rate and regular rhythm.   Pulmonary/Chest: Effort normal and breath sounds normal. No respiratory distress.  Abdominal: Soft. Bowel sounds are normal. There is no tenderness.  Musculoskeletal: He exhibits no edema or tenderness.  Lymphadenopathy:    He has no cervical adenopathy.  Skin: No rash noted. No erythema.  Psychiatric: He has a normal mood and affect.  His behavior is normal.    BP 120/80 mmHg  Pulse 104  Temp(Src) 98.7 F (37.1 C) (Oral)  Wt 296 lb 12.8 oz (134.628 kg) Wt Readings from Last 3 Encounters:  01/10/16 296 lb 12.8 oz (134.628 kg)  03/13/15 291 lb 4 oz (132.11 kg)  12/19/14 283 lb 8 oz (128.595 kg)     Lab Results  Component Value Date   WBC 9.3 11/06/2014   HGB 15.6 11/06/2014   HCT 45.8 11/06/2014   PLT 365.0 11/06/2014   GLUCOSE 95 04/26/2015   CHOL 171 04/26/2015   TRIG 143.0 04/26/2015   HDL 40.20 04/26/2015   LDLDIRECT 159.0 11/06/2014     LDLCALC 102* 04/26/2015   ALT 43 04/26/2015   AST 25 04/26/2015   NA 139 04/26/2015   K 4.5 04/26/2015   CL 102 04/26/2015   CREATININE 0.97 04/26/2015   BUN 13 04/26/2015   CO2 29 04/26/2015   TSH 3.08 11/06/2014       Assessment & Plan:   Problem List Items Addressed This Visit    Change in bowel function    Bowels doing well.  Follow.       Hypercholesterolemia - Primary    On lipitor.  Low cholesterol diet and exercise.  Follow lipid panel and liver function tests.        Relevant Orders   CBC with Differential/Platelet   Comprehensive metabolic panel   TSH   Lipid panel       Dale DurhamSCOTT, Yordin Rhoda, MD

## 2016-01-10 NOTE — Progress Notes (Signed)
Pre visit review using our clinic review tool, if applicable. No additional management support is needed unless otherwise documented below in the visit note. 

## 2016-01-14 ENCOUNTER — Encounter: Payer: Self-pay | Admitting: Internal Medicine

## 2016-01-14 NOTE — Assessment & Plan Note (Signed)
On lipitor.  Low cholesterol diet and exercise.  Follow lipid panel and liver function tests.   

## 2016-01-14 NOTE — Assessment & Plan Note (Signed)
Bowels doing well.  Follow.   

## 2016-01-24 ENCOUNTER — Other Ambulatory Visit (INDEPENDENT_AMBULATORY_CARE_PROVIDER_SITE_OTHER): Payer: BC Managed Care – PPO

## 2016-01-24 DIAGNOSIS — E7800 Pure hypercholesterolemia, unspecified: Secondary | ICD-10-CM

## 2016-01-24 LAB — COMPREHENSIVE METABOLIC PANEL
ALBUMIN: 4.6 g/dL (ref 3.5–5.2)
ALK PHOS: 96 U/L (ref 39–117)
ALT: 39 U/L (ref 0–53)
AST: 27 U/L (ref 0–37)
BILIRUBIN TOTAL: 0.6 mg/dL (ref 0.2–1.2)
BUN: 12 mg/dL (ref 6–23)
CO2: 29 mEq/L (ref 19–32)
Calcium: 9.6 mg/dL (ref 8.4–10.5)
Chloride: 102 mEq/L (ref 96–112)
Creatinine, Ser: 0.87 mg/dL (ref 0.40–1.50)
GFR: 106.33 mL/min (ref >60.00)
GLUCOSE: 81 mg/dL (ref 70–99)
POTASSIUM: 4.1 meq/L (ref 3.5–5.1)
SODIUM: 138 meq/L (ref 135–145)
TOTAL PROTEIN: 7.8 g/dL (ref 6.0–8.3)

## 2016-01-24 LAB — CBC WITH DIFFERENTIAL/PLATELET
BASOS ABS: 0.1 10*3/uL (ref 0.0–0.1)
Basophils Relative: 0.4 % (ref 0.0–3.0)
EOS PCT: 4.4 % (ref 0.0–5.0)
Eosinophils Absolute: 0.5 10*3/uL (ref 0.0–0.7)
HCT: 45.4 % (ref 39.0–52.0)
HEMOGLOBIN: 15.3 g/dL (ref 13.0–17.0)
Lymphocytes Relative: 24.9 % (ref 12.0–46.0)
Lymphs Abs: 3 10*3/uL (ref 0.7–4.0)
MCHC: 33.7 g/dL (ref 30.0–36.0)
MCV: 85.4 fl (ref 78.0–100.0)
MONO ABS: 0.9 10*3/uL (ref 0.1–1.0)
MONOS PCT: 7.2 % (ref 3.0–12.0)
Neutro Abs: 7.5 10*3/uL (ref 1.4–7.7)
Neutrophils Relative %: 63.1 % (ref 43.0–77.0)
Platelets: 396 10*3/uL (ref 150.0–400.0)
RBC: 5.31 Mil/uL (ref 4.22–5.81)
RDW: 13.4 % (ref 11.5–15.5)
WBC: 11.9 10*3/uL — AB (ref 4.0–10.5)

## 2016-01-24 LAB — LIPID PANEL
CHOL/HDL RATIO: 4
Cholesterol: 175 mg/dL (ref 0–200)
HDL: 47.7 mg/dL (ref >39.00)
LDL Cholesterol: 98 mg/dL (ref 0–99)
NONHDL: 127.21
Triglycerides: 146 mg/dL (ref 0.0–149.0)
VLDL: 29.2 mg/dL (ref 0.0–40.0)

## 2016-01-24 LAB — TSH: TSH: 2.51 u[IU]/mL (ref 0.35–4.50)

## 2016-02-11 ENCOUNTER — Other Ambulatory Visit (INDEPENDENT_AMBULATORY_CARE_PROVIDER_SITE_OTHER): Payer: BC Managed Care – PPO

## 2016-02-11 ENCOUNTER — Telehealth: Payer: Self-pay | Admitting: *Deleted

## 2016-02-11 DIAGNOSIS — D72829 Elevated white blood cell count, unspecified: Secondary | ICD-10-CM

## 2016-02-11 NOTE — Telephone Encounter (Signed)
Covering for Dr Lorin PicketScott. Orders placed after review of most recent labs and result note.

## 2016-02-11 NOTE — Telephone Encounter (Signed)
Labs and dx?  

## 2016-02-12 LAB — CBC
HCT: 44.2 % (ref 39.0–52.0)
Hemoglobin: 14.9 g/dL (ref 13.0–17.0)
MCHC: 33.6 g/dL (ref 30.0–36.0)
MCV: 85.1 fl (ref 78.0–100.0)
PLATELETS: 378 10*3/uL (ref 150.0–400.0)
RBC: 5.2 Mil/uL (ref 4.22–5.81)
RDW: 13.4 % (ref 11.5–15.5)
WBC: 13.6 10*3/uL — AB (ref 4.0–10.5)

## 2016-02-13 ENCOUNTER — Encounter: Payer: Self-pay | Admitting: Internal Medicine

## 2016-02-13 NOTE — Telephone Encounter (Signed)
Could this contribute to the increase in WBC?

## 2016-02-19 ENCOUNTER — Telehealth: Payer: Self-pay | Admitting: *Deleted

## 2016-02-19 NOTE — Telephone Encounter (Signed)
Please advise a place on Dr. Roby LoftsScott's schedule to place patient to talk about blood work.   Pt contact [phone]

## 2016-02-19 NOTE — Telephone Encounter (Signed)
Patient would like a follow up appt to discuss his recent lab results.  He had sent an email while you were out of town requesting to discuss.  Dr. Dan HumphreysWalker suggested an appt with you.  Thanks

## 2016-02-19 NOTE — Telephone Encounter (Signed)
There is a spot being held for another pt on Thursday 02/21/16 at 9:00.  Need to confirm if this pt is coming.  If not, I can see Blain PaisMatthew Branscome at 9:00 on Thursday to discuss labs, etc.  Thanks

## 2016-02-20 NOTE — Telephone Encounter (Signed)
That time slot was used, but you had a cancellation at 230, so I used that one.  Thanks

## 2016-02-21 ENCOUNTER — Ambulatory Visit (INDEPENDENT_AMBULATORY_CARE_PROVIDER_SITE_OTHER): Payer: BC Managed Care – PPO | Admitting: Internal Medicine

## 2016-02-21 ENCOUNTER — Encounter: Payer: Self-pay | Admitting: Internal Medicine

## 2016-02-21 VITALS — BP 130/70 | HR 99 | Temp 98.7°F | Resp 18 | Ht 71.5 in | Wt 299.0 lb

## 2016-02-21 DIAGNOSIS — R197 Diarrhea, unspecified: Secondary | ICD-10-CM

## 2016-02-21 DIAGNOSIS — D72829 Elevated white blood cell count, unspecified: Secondary | ICD-10-CM

## 2016-02-21 NOTE — Progress Notes (Signed)
Patient ID: Glennon MacMatthew Y Zuch, male   DOB: 15-Feb-1981, 35 y.o.   MRN: [id number]   Subjective:    Patient ID: Glennon MacMatthew Y Weihe, male    DOB: 15-Feb-1981, 35 y.o.   MRN: [id number]  HPI  Patient here as a work in to discuss recent labs.  Was found to have slightly elevated white blood cell count.  Recheck increased more.  He was having diarrhea.  At its worse - three stools per day.  No abdominal pain.  No fever.  No increased cough or congestion.  Some allergy symptoms, but relatively stable.  No abdominal pain or cramping.  No blood.  No urine change.     Past Medical History  Diagnosis Date  . Overactive bladder   . GERD (gastroesophageal reflux disease)    No past surgical history on file. Family History  Problem Relation Age of Onset  . Colon cancer Neg Hx   . Prostate cancer Paternal Grandfather   . Prostate cancer Paternal Uncle   . Lung cancer Maternal Grandmother   . Heart disease Paternal Grandfather   . Hypertension Mother   . Hypertension Father   . Hypercholesterolemia Mother   . Hypercholesterolemia Father   . Diabetes Mother    Social History   Social History  . Marital Status: Single    Spouse Name: N/A  . Number of Children: N/A  . Years of Education: N/A   Occupational History  .     Social History Main Topics  . Smoking status: Never Smoker   . Smokeless tobacco: Current User    Types: Chew  . Alcohol Use: 0.0 oz/week    0 Standard drinks or equivalent per week     Comment: three to four beers on the weekend  . Drug Use: No  . Sexual Activity: Not Asked   Other Topics Concern  . None   Social History Narrative    Outpatient Encounter Prescriptions as of 02/21/2016  Medication Sig  . atorvastatin (LIPITOR) 10 MG tablet take 1 tablet by mouth once daily  . fluticasone (FLONASE) 50 MCG/ACT nasal spray Place 2 sprays into both nostrils daily.  . Multiple Vitamin (MULTIVITAMIN) tablet Take 1 tablet by mouth daily.   No facility-administered  encounter medications on file as of 02/21/2016.    Review of Systems  Constitutional: Negative for appetite change and unexpected weight change.  HENT: Negative for congestion and sinus pressure.   Respiratory: Negative for cough, chest tightness and shortness of breath.   Cardiovascular: Negative for chest pain, palpitations and leg swelling.  Gastrointestinal: Negative for nausea, vomiting and abdominal pain.       Diarrhea improved.   Genitourinary: Negative for dysuria and difficulty urinating.  Musculoskeletal: Negative for back pain and joint swelling.  Skin: Negative for color change and rash.  Neurological: Negative for dizziness, light-headedness and headaches.  Psychiatric/Behavioral: Negative for dysphoric mood and agitation.       Objective:    Physical Exam  Constitutional: He appears well-developed and well-nourished. No distress.  HENT:  Nose: Nose normal.  Mouth/Throat: Oropharynx is clear and moist.  Neck: Neck supple. No thyromegaly present.  Cardiovascular: Normal rate and regular rhythm.   Pulmonary/Chest: Effort normal and breath sounds normal. No respiratory distress.  Abdominal: Soft. Bowel sounds are normal. There is no tenderness.  Musculoskeletal: He exhibits no edema or tenderness.  Lymphadenopathy:    He has no cervical adenopathy.  Skin: No rash noted. No erythema.  Psychiatric: He has a normal  mood and affect. His behavior is normal.    BP 130/70 mmHg  Pulse 99  Temp(Src) 98.7 F (37.1 C) (Oral)  Resp 18  Ht 5' 11.5" (1.816 m)  Wt 299 lb (135.626 kg)  BMI 41.13 kg/m2  SpO2 95% Wt Readings from Last 3 Encounters:  02/21/16 299 lb (135.626 kg)  01/10/16 296 lb 12.8 oz (134.628 kg)  03/13/15 291 lb 4 oz (132.11 kg)     Lab Results  Component Value Date   WBC 13.6* 02/11/2016   HGB 14.9 02/11/2016   HCT 44.2 02/11/2016   PLT 378.0 02/11/2016   GLUCOSE 81 01/24/2016   CHOL 175 01/24/2016   TRIG 146.0 01/24/2016   HDL 47.70  01/24/2016   LDLDIRECT 159.0 11/06/2014   LDLCALC 98 01/24/2016   ALT 39 01/24/2016   AST 27 01/24/2016   NA 138 01/24/2016   K 4.1 01/24/2016   CL 102 01/24/2016   CREATININE 0.87 01/24/2016   BUN 12 01/24/2016   CO2 29 01/24/2016   TSH 2.51 01/24/2016       Assessment & Plan:   Problem List Items Addressed This Visit    Diarrhea    Previous diarrhea.  Improved now.  Start probiotic.  Follow.        Leukocytosis - Primary    Elevated white blood cell count.  Recheck slightly increased.  Feeling well.  Diarrhea better.  Probiotic.  Stay hydrated.  Recheck cbc.        Relevant Orders   CBC with Differential/Platelet       Dale DurhamSCOTT, Evamae Rowen, MD

## 2016-02-21 NOTE — Patient Instructions (Signed)
Once daily  -- probiotic  [employer]

## 2016-02-21 NOTE — Progress Notes (Signed)
Pre-visit discussion using our clinic review tool. No additional management support is needed unless otherwise documented below in the visit note.  

## 2016-02-23 ENCOUNTER — Encounter: Payer: Self-pay | Admitting: Internal Medicine

## 2016-02-23 DIAGNOSIS — D72829 Elevated white blood cell count, unspecified: Secondary | ICD-10-CM | POA: Insufficient documentation

## 2016-02-23 NOTE — Assessment & Plan Note (Signed)
Elevated white blood cell count.  Recheck slightly increased.  Feeling well.  Diarrhea better.  Probiotic.  Stay hydrated.  Recheck cbc.

## 2016-02-23 NOTE — Assessment & Plan Note (Signed)
Previous diarrhea.  Improved now.  Start probiotic.  Follow.

## 2016-03-14 ENCOUNTER — Other Ambulatory Visit (INDEPENDENT_AMBULATORY_CARE_PROVIDER_SITE_OTHER): Payer: BC Managed Care – PPO

## 2016-03-14 DIAGNOSIS — D72829 Elevated white blood cell count, unspecified: Secondary | ICD-10-CM | POA: Diagnosis not present

## 2016-03-14 LAB — CBC WITH DIFFERENTIAL/PLATELET
Basophils Absolute: 0 10*3/uL (ref 0.0–0.1)
Basophils Relative: 0.4 % (ref 0.0–3.0)
EOS ABS: 0.5 10*3/uL (ref 0.0–0.7)
EOS PCT: 4.8 % (ref 0.0–5.0)
HEMATOCRIT: 44.5 % (ref 39.0–52.0)
HEMOGLOBIN: 15 g/dL (ref 13.0–17.0)
LYMPHS PCT: 26.2 % (ref 12.0–46.0)
Lymphs Abs: 3 10*3/uL (ref 0.7–4.0)
MCHC: 33.7 g/dL (ref 30.0–36.0)
MCV: 86.1 fl (ref 78.0–100.0)
MONO ABS: 1 10*3/uL (ref 0.1–1.0)
Monocytes Relative: 9.1 % (ref 3.0–12.0)
Neutro Abs: 6.8 10*3/uL (ref 1.4–7.7)
Neutrophils Relative %: 59.5 % (ref 43.0–77.0)
Platelets: 386 10*3/uL (ref 150.0–400.0)
RBC: 5.17 Mil/uL (ref 4.22–5.81)
RDW: 13.3 % (ref 11.5–15.5)
WBC: 11.4 10*3/uL — AB (ref 4.0–10.5)

## 2016-03-18 ENCOUNTER — Encounter: Payer: Self-pay | Admitting: Internal Medicine

## 2016-05-06 ENCOUNTER — Other Ambulatory Visit: Payer: Self-pay | Admitting: Internal Medicine

## 2016-05-12 ENCOUNTER — Encounter: Payer: BC Managed Care – PPO | Admitting: Internal Medicine

## 2016-06-27 ENCOUNTER — Encounter: Payer: Self-pay | Admitting: Internal Medicine

## 2016-06-27 ENCOUNTER — Ambulatory Visit (INDEPENDENT_AMBULATORY_CARE_PROVIDER_SITE_OTHER): Payer: BC Managed Care – PPO | Admitting: Internal Medicine

## 2016-06-27 VITALS — BP 128/84 | HR 102 | Temp 97.9°F | Ht 72.0 in | Wt 302.8 lb

## 2016-06-27 DIAGNOSIS — Z0000 Encounter for general adult medical examination without abnormal findings: Secondary | ICD-10-CM | POA: Diagnosis not present

## 2016-06-27 DIAGNOSIS — D72829 Elevated white blood cell count, unspecified: Secondary | ICD-10-CM

## 2016-06-27 DIAGNOSIS — E7800 Pure hypercholesterolemia, unspecified: Secondary | ICD-10-CM

## 2016-06-27 DIAGNOSIS — Z23 Encounter for immunization: Secondary | ICD-10-CM | POA: Diagnosis not present

## 2016-06-27 DIAGNOSIS — R030 Elevated blood-pressure reading, without diagnosis of hypertension: Secondary | ICD-10-CM

## 2016-06-27 DIAGNOSIS — R0683 Snoring: Secondary | ICD-10-CM | POA: Diagnosis not present

## 2016-06-27 NOTE — Progress Notes (Addendum)
Patient ID: Gary Fleming, male   DOB: [birth year], 35 y.o.   MRN: [id number]   Subjective:    Patient ID: Gary Fleming, male    DOB: [birth year], 35 y.o.   MRN: [id number]  HPI  Patient here for his physical exam.  Sees urology.  Is up to date.  Stays active.  No chest pain.  Does report persistent snoring.  Waking up at night.  Some fatigue.  Had discussed previously obtaining a sleep study.  He is in agreement now for sleep study.  Works out.  Goes to the gym.  Has gained weight.  Discussed diet and exercise.  He does not watch what he eats.  No chest pain.  No sob.  No acid reflux.  No abdominal pain or cramping.  Bowels stable.     Past Medical History:  Diagnosis Date  . GERD (gastroesophageal reflux disease)   . Overactive bladder    History reviewed. No pertinent surgical history. Family History  Problem Relation Age of Onset  . Prostate cancer Paternal Grandfather   . Heart disease Paternal Grandfather   . Prostate cancer Paternal Uncle   . Lung cancer Maternal Grandmother   . Hypertension Mother   . Hypercholesterolemia Mother   . Diabetes Mother   . Hypertension Father   . Hypercholesterolemia Father   . Colon cancer Neg Hx    Social History   Social History  . Marital status: Single    Spouse name: N/A  . Number of children: N/A  . Years of education: N/A   Occupational History  .  [employer]   Social History Main Topics  . Smoking status: Never Smoker  . Smokeless tobacco: Current User    Types: Chew  . Alcohol use 0.0 oz/week     Comment: three to four beers on the weekend  . Drug use: No  . Sexual activity: Not Asked   Other Topics Concern  . None   Social History Narrative  . None    Outpatient Encounter Prescriptions as of 06/27/2016  Medication Sig  . atorvastatin (LIPITOR) 10 MG tablet take 1 tablet by mouth once daily  . fluticasone (FLONASE) 50 MCG/ACT nasal spray Place 2 sprays into both nostrils daily.  . Multiple  Vitamin (MULTIVITAMIN) tablet Take 1 tablet by mouth daily.   No facility-administered encounter medications on file as of 06/27/2016.     Review of Systems  Constitutional: Negative for appetite change and unexpected weight change.  HENT: Negative for congestion, sinus pressure and sore throat.   Eyes: Negative for pain and visual disturbance.  Respiratory: Negative for cough, chest tightness and shortness of breath.   Cardiovascular: Negative for chest pain, palpitations and leg swelling.  Gastrointestinal: Negative for abdominal pain, diarrhea, nausea and vomiting.  Genitourinary: Negative for difficulty urinating and dysuria.  Musculoskeletal: Negative for back pain and joint swelling.  Skin: Negative for color change and rash.  Neurological: Negative for dizziness, light-headedness and headaches.  Hematological: Negative for adenopathy. Does not bruise/bleed easily.  Psychiatric/Behavioral: Negative for agitation and dysphoric mood.       Objective:     Blood pressure rechecked by me:  128/84  Physical Exam  Constitutional: He is oriented to person, place, and time. He appears well-developed and well-nourished. No distress.  HENT:  Head: Normocephalic and atraumatic.  Nose: Nose normal.  Mouth/Throat: Oropharynx is clear and moist. No oropharyngeal exudate.  Eyes: Conjunctivae are normal. Right eye exhibits no discharge. Left eye  exhibits no discharge.  Neck: Neck supple. No thyromegaly present.  Cardiovascular: Normal rate and regular rhythm.   Pulmonary/Chest: Breath sounds normal. No respiratory distress. He has no wheezes.  Abdominal: Soft. Bowel sounds are normal. There is no tenderness.  Genitourinary:  Genitourinary Comments: Performed by urology.   Musculoskeletal: He exhibits no edema or tenderness.  Lymphadenopathy:    He has no cervical adenopathy.  Neurological: He is alert and oriented to person, place, and time.  Skin: Skin is warm and dry. No rash noted.    Psychiatric: He has a normal mood and affect. His behavior is normal.    BP 128/84   Pulse (!) 102   Temp 97.9 F (36.6 C) (Oral)   Ht 6' (1.829 m)   Wt (!) 302 lb 12.8 oz (137.3 kg)   SpO2 96%   BMI 41.07 kg/m  Wt Readings from Last 3 Encounters:  06/27/16 (!) 302 lb 12.8 oz (137.3 kg)  02/21/16 299 lb (135.6 kg)  01/10/16 296 lb 12.8 oz (134.6 kg)     Lab Results  Component Value Date   WBC 11.4 (H) 03/14/2016   HGB 15.0 03/14/2016   HCT 44.5 03/14/2016   PLT 386.0 03/14/2016   GLUCOSE 81 01/24/2016   CHOL 175 01/24/2016   TRIG 146.0 01/24/2016   HDL 47.70 01/24/2016   LDLDIRECT 159.0 11/06/2014   LDLCALC 98 01/24/2016   ALT 39 01/24/2016   AST 27 01/24/2016   NA 138 01/24/2016   K 4.1 01/24/2016   CL 102 01/24/2016   CREATININE 0.87 01/24/2016   BUN 12 01/24/2016   CO2 29 01/24/2016   TSH 2.51 01/24/2016       Assessment & Plan:   Problem List Items Addressed This Visit    Health care maintenance    Physical today - 06/27/16.  Sees urology for prostate and genital check.  Check cholesterol.        Hypercholesterolemia    Low cholesterol diet and exercise.  Follow lipid panel.        Relevant Orders   Hepatic function panel   Lipid panel   Leukocytosis    Currently doing well.  Recheck cbc.        Relevant Orders   CBC with Differential/Platelet   Snoring    Has snoring and has been waking up at night.  Also with some fatigue.  Check split night sleep study.        Relevant Orders   Ambulatory referral to Sleep Studies    Other Visit Diagnoses    Routine general medical examination at a health care facility    -  Primary   Encounter for immunization       Relevant Orders   Flu Vaccine QUAD 36+ mos IM (Completed)   CBC with Differential/Platelet   Hepatic function panel   Lipid panel   Basic metabolic panel   Elevated blood pressure reading       elevated blood pressure initially.  recheck wnl.  follow.     Relevant Orders   Basic  metabolic panel       Dale DurhamSCOTT, Jobe Mutch, MD

## 2016-06-27 NOTE — Progress Notes (Signed)
Pre visit review using our clinic review tool, if applicable. No additional management support is needed unless otherwise documented below in the visit note. 

## 2016-06-29 ENCOUNTER — Encounter: Payer: Self-pay | Admitting: Internal Medicine

## 2016-06-29 DIAGNOSIS — G4730 Sleep apnea, unspecified: Secondary | ICD-10-CM | POA: Insufficient documentation

## 2016-06-29 NOTE — Assessment & Plan Note (Signed)
Physical today - 06/27/16.  Sees urology for prostate and genital check.  Check cholesterol.

## 2016-06-29 NOTE — Assessment & Plan Note (Signed)
Has snoring and has been waking up at night.  Also with some fatigue.  Check split night sleep study.

## 2016-06-29 NOTE — Assessment & Plan Note (Signed)
Currently doing well.  Recheck cbc.

## 2016-06-29 NOTE — Assessment & Plan Note (Signed)
Low cholesterol diet and exercise.  Follow lipid panel.   

## 2016-06-29 NOTE — Addendum Note (Signed)
Addended by: Charm BargesSCOTT, Aletta Edmunds S on: 06/29/2016 11:33 AM   Modules accepted: Orders

## 2016-07-04 ENCOUNTER — Other Ambulatory Visit (INDEPENDENT_AMBULATORY_CARE_PROVIDER_SITE_OTHER): Payer: BC Managed Care – PPO

## 2016-07-04 DIAGNOSIS — R030 Elevated blood-pressure reading, without diagnosis of hypertension: Secondary | ICD-10-CM | POA: Diagnosis not present

## 2016-07-04 DIAGNOSIS — Z23 Encounter for immunization: Secondary | ICD-10-CM

## 2016-07-04 DIAGNOSIS — E7800 Pure hypercholesterolemia, unspecified: Secondary | ICD-10-CM

## 2016-07-04 DIAGNOSIS — D72829 Elevated white blood cell count, unspecified: Secondary | ICD-10-CM | POA: Diagnosis not present

## 2016-07-04 LAB — CBC WITH DIFFERENTIAL/PLATELET
BASOS ABS: 0 10*3/uL (ref 0.0–0.1)
Basophils Relative: 0.2 % (ref 0.0–3.0)
EOS ABS: 0.5 10*3/uL (ref 0.0–0.7)
Eosinophils Relative: 5 % (ref 0.0–5.0)
HEMATOCRIT: 45.6 % (ref 39.0–52.0)
HEMOGLOBIN: 15.2 g/dL (ref 13.0–17.0)
LYMPHS PCT: 26.5 % (ref 12.0–46.0)
Lymphs Abs: 2.7 10*3/uL (ref 0.7–4.0)
MCHC: 33.3 g/dL (ref 30.0–36.0)
MCV: 85.9 fl (ref 78.0–100.0)
MONO ABS: 0.9 10*3/uL (ref 0.1–1.0)
Monocytes Relative: 8.3 % (ref 3.0–12.0)
Neutro Abs: 6.2 10*3/uL (ref 1.4–7.7)
Neutrophils Relative %: 60 % (ref 43.0–77.0)
Platelets: 383 10*3/uL (ref 150.0–400.0)
RBC: 5.31 Mil/uL (ref 4.22–5.81)
RDW: 13.7 % (ref 11.5–15.5)
WBC: 10.4 10*3/uL (ref 4.0–10.5)

## 2016-07-04 LAB — BASIC METABOLIC PANEL
BUN: 10 mg/dL (ref 6–23)
CHLORIDE: 104 meq/L (ref 96–112)
CO2: 30 meq/L (ref 19–32)
CREATININE: 0.97 mg/dL (ref 0.40–1.50)
Calcium: 9.5 mg/dL (ref 8.4–10.5)
GFR: 93.54 mL/min (ref >60.00)
Glucose, Bld: 93 mg/dL (ref 70–99)
Potassium: 4.2 mEq/L (ref 3.5–5.1)
SODIUM: 140 meq/L (ref 135–145)

## 2016-07-04 LAB — LIPID PANEL
CHOL/HDL RATIO: 4
Cholesterol: 157 mg/dL (ref 0–200)
HDL: 43.9 mg/dL (ref >39.00)
LDL CALC: 94 mg/dL (ref 0–99)
NONHDL: 113.52
Triglycerides: 98 mg/dL (ref 0.0–149.0)
VLDL: 19.6 mg/dL (ref 0.0–40.0)

## 2016-07-04 LAB — HEPATIC FUNCTION PANEL
ALK PHOS: 91 U/L (ref 39–117)
ALT: 36 U/L (ref 0–53)
AST: 23 U/L (ref 0–37)
Albumin: 4.4 g/dL (ref 3.5–5.2)
BILIRUBIN DIRECT: 0.1 mg/dL (ref 0.0–0.3)
BILIRUBIN TOTAL: 0.6 mg/dL (ref 0.2–1.2)
TOTAL PROTEIN: 7.4 g/dL (ref 6.0–8.3)

## 2016-07-06 ENCOUNTER — Encounter: Payer: Self-pay | Admitting: Internal Medicine

## 2016-08-20 ENCOUNTER — Ambulatory Visit: Payer: BC Managed Care – PPO | Attending: Otolaryngology

## 2016-08-20 DIAGNOSIS — F5101 Primary insomnia: Secondary | ICD-10-CM | POA: Insufficient documentation

## 2016-08-20 DIAGNOSIS — R0683 Snoring: Secondary | ICD-10-CM | POA: Diagnosis present

## 2016-09-01 ENCOUNTER — Telehealth: Payer: Self-pay | Admitting: Surgical

## 2016-09-01 NOTE — Telephone Encounter (Signed)
Spoke with patient and gave results of sleep study. He does not want to be referred at this time. He said that he would discuss further at next appointment.

## 2016-11-26 ENCOUNTER — Other Ambulatory Visit: Payer: Self-pay | Admitting: Internal Medicine

## 2016-12-30 ENCOUNTER — Ambulatory Visit (INDEPENDENT_AMBULATORY_CARE_PROVIDER_SITE_OTHER): Payer: BC Managed Care – PPO | Admitting: Internal Medicine

## 2016-12-30 ENCOUNTER — Encounter: Payer: Self-pay | Admitting: Internal Medicine

## 2016-12-30 DIAGNOSIS — E7800 Pure hypercholesterolemia, unspecified: Secondary | ICD-10-CM

## 2016-12-30 DIAGNOSIS — G479 Sleep disorder, unspecified: Secondary | ICD-10-CM

## 2016-12-30 NOTE — Progress Notes (Signed)
Patient ID: Gary Fleming, male   DOB: 1981/06/27, 36 y.o.   MRN: [id number]   Subjective:    Patient ID: Gary Fleming, male    DOB: 1981/06/27, 36 y.o.   MRN: [id number]  HPI  Patient here for a scheduled follow up.  States he is doing well.  Did have sleep study.  Did not sleep for the study.  Report result reviewed.  States - chronic insomnia disorder.  Discussed with him today.  Discussed repeating sleep study with sleep aid.  He declines now.  States overall he is doing well.  No chest pain.  Going to the gym.  No sob.  No acid reflux.  No abdominal pain.  Bowels moving.  No urine change.     Past Medical History:  Diagnosis Date  . GERD (gastroesophageal reflux disease)   . Overactive bladder    History reviewed. No pertinent surgical history. Family History  Problem Relation Age of Onset  . Prostate cancer Paternal Grandfather   . Heart disease Paternal Grandfather   . Prostate cancer Paternal Uncle   . Lung cancer Maternal Grandmother   . Hypertension Mother   . Hypercholesterolemia Mother   . Diabetes Mother   . Hypertension Father   . Hypercholesterolemia Father   . Colon cancer Neg Hx    Social History   Social History  . Marital status: Single    Spouse name: N/A  . Number of children: N/A  . Years of education: N/A   Occupational History  .  [employer]   Social History Main Topics  . Smoking status: Never Smoker  . Smokeless tobacco: Current User    Types: Chew  . Alcohol use 0.0 oz/week     Comment: three to four beers on the weekend  . Drug use: No  . Sexual activity: Not Asked   Other Topics Concern  . None   Social History Narrative  . None    Outpatient Encounter Prescriptions as of 12/30/2016  Medication Sig  . atorvastatin (LIPITOR) 10 MG tablet take 1 tablet by mouth once daily  . fluticasone (FLONASE) 50 MCG/ACT nasal spray Place 2 sprays into both nostrils daily.  . Multiple Vitamin (MULTIVITAMIN) tablet Take 1  tablet by mouth daily.   No facility-administered encounter medications on file as of 12/30/2016.     Review of Systems  Constitutional: Negative for appetite change and unexpected weight change.  HENT: Negative for congestion and sinus pressure.   Respiratory: Negative for cough, chest tightness and shortness of breath.   Cardiovascular: Negative for chest pain, palpitations and leg swelling.  Gastrointestinal: Negative for abdominal pain, diarrhea, nausea and vomiting.  Genitourinary: Negative for difficulty urinating and dysuria.  Musculoskeletal: Negative for back pain and joint swelling.  Skin: Negative for color change and rash.  Neurological: Negative for dizziness, light-headedness and headaches.  Psychiatric/Behavioral: Positive for sleep disturbance. Negative for agitation and dysphoric mood.       Objective:     Blood pressure rechecked by me:  130/84  Physical Exam  Constitutional: He appears well-developed and well-nourished. No distress.  HENT:  Nose: Nose normal.  Mouth/Throat: Oropharynx is clear and moist.  Neck: Neck supple. No thyromegaly present.  Cardiovascular: Normal rate and regular rhythm.   Pulmonary/Chest: Effort normal and breath sounds normal. No respiratory distress.  Abdominal: Soft. Bowel sounds are normal. There is no tenderness.  Musculoskeletal: He exhibits no edema or tenderness.  Lymphadenopathy:    He has no  cervical adenopathy.  Skin: No rash noted. No erythema.  Psychiatric: He has a normal mood and affect. His behavior is normal.    BP 132/86   Pulse (!) 103   Temp 98.3 F (36.8 C) (Oral)   Wt (!) 301 lb 3.2 oz (136.6 kg)   SpO2 96%   BMI 40.85 kg/m  Wt Readings from Last 3 Encounters:  12/30/16 (!) 301 lb 3.2 oz (136.6 kg)  06/27/16 (!) 302 lb 12.8 oz (137.3 kg)  02/21/16 299 lb (135.6 kg)     Lab Results  Component Value Date   WBC 10.4 07/04/2016   HGB 15.2 07/04/2016   HCT 45.6 07/04/2016   PLT 383.0 07/04/2016    GLUCOSE 93 07/04/2016   CHOL 157 07/04/2016   TRIG 98.0 07/04/2016   HDL 43.90 07/04/2016   LDLDIRECT 159.0 11/06/2014   LDLCALC 94 07/04/2016   ALT 36 07/04/2016   AST 23 07/04/2016   NA 140 07/04/2016   K 4.2 07/04/2016   CL 104 07/04/2016   CREATININE 0.97 07/04/2016   BUN 10 07/04/2016   CO2 30 07/04/2016   TSH 2.51 01/24/2016       Assessment & Plan:   Problem List Items Addressed This Visit    Difficulty sleeping    Had sleep study as outlined.  Chronic insomnia disorder.  Discussed f/u study with sleep aid.  He declines.  Wants to follow.        Hypercholesterolemia    Low cholesterol diet and exercise.  Follow lipid panel and liver function tests.  On lipitor.        Relevant Orders   Hepatic function panel   Lipid panel   TSH   Basic metabolic panel       Dale Pinehurst, MD

## 2017-01-04 ENCOUNTER — Encounter: Payer: Self-pay | Admitting: Internal Medicine

## 2017-01-04 NOTE — Assessment & Plan Note (Addendum)
Low cholesterol diet and exercise.  Follow lipid panel and liver function tests.  On lipitor.   

## 2017-01-04 NOTE — Assessment & Plan Note (Signed)
Had sleep study as outlined.  Chronic insomnia disorder.  Discussed f/u study with sleep aid.  He declines.  Wants to follow.

## 2017-01-13 ENCOUNTER — Other Ambulatory Visit (INDEPENDENT_AMBULATORY_CARE_PROVIDER_SITE_OTHER): Payer: BC Managed Care – PPO

## 2017-01-13 DIAGNOSIS — E7800 Pure hypercholesterolemia, unspecified: Secondary | ICD-10-CM

## 2017-01-13 LAB — BASIC METABOLIC PANEL
BUN: 10 mg/dL (ref 6–23)
CHLORIDE: 101 meq/L (ref 96–112)
CO2: 32 mEq/L (ref 19–32)
Calcium: 9.7 mg/dL (ref 8.4–10.5)
Creatinine, Ser: 0.96 mg/dL (ref 0.40–1.50)
GFR: 94.38 mL/min (ref >60.00)
Glucose, Bld: 101 mg/dL — ABNORMAL HIGH (ref 70–99)
POTASSIUM: 4.7 meq/L (ref 3.5–5.1)
Sodium: 140 mEq/L (ref 135–145)

## 2017-01-13 LAB — LIPID PANEL
CHOL/HDL RATIO: 4
Cholesterol: 166 mg/dL (ref 0–200)
HDL: 43.3 mg/dL (ref >39.00)
LDL Cholesterol: 103 mg/dL — ABNORMAL HIGH (ref 0–99)
NonHDL: 122.97
TRIGLYCERIDES: 101 mg/dL (ref 0.0–149.0)
VLDL: 20.2 mg/dL (ref 0.0–40.0)

## 2017-01-13 LAB — HEPATIC FUNCTION PANEL
ALT: 37 U/L (ref 0–53)
AST: 21 U/L (ref 0–37)
Albumin: 4.6 g/dL (ref 3.5–5.2)
Alkaline Phosphatase: 96 U/L (ref 39–117)
BILIRUBIN DIRECT: 0.2 mg/dL (ref 0.0–0.3)
TOTAL PROTEIN: 7.2 g/dL (ref 6.0–8.3)
Total Bilirubin: 0.8 mg/dL (ref 0.2–1.2)

## 2017-01-13 LAB — TSH: TSH: 2.83 u[IU]/mL (ref 0.35–4.50)

## 2017-01-14 ENCOUNTER — Encounter: Payer: Self-pay | Admitting: Internal Medicine

## 2017-02-09 ENCOUNTER — Emergency Department
Admission: EM | Admit: 2017-02-09 | Discharge: 2017-02-10 | Disposition: A | Discharge status: Home / Self Care | Payer: BC Managed Care – PPO | Attending: Emergency Medicine | Admitting: Emergency Medicine

## 2017-02-09 ENCOUNTER — Encounter: Payer: Self-pay | Admitting: Emergency Medicine

## 2017-02-09 ENCOUNTER — Other Ambulatory Visit: Payer: Self-pay

## 2017-02-09 ENCOUNTER — Emergency Department: Payer: BC Managed Care – PPO

## 2017-02-09 DIAGNOSIS — R079 Chest pain, unspecified: Secondary | ICD-10-CM | POA: Diagnosis not present

## 2017-02-09 DIAGNOSIS — Z79899 Other long term (current) drug therapy: Secondary | ICD-10-CM | POA: Insufficient documentation

## 2017-02-09 LAB — TROPONIN I

## 2017-02-09 LAB — BASIC METABOLIC PANEL
Anion gap: 8 (ref 5–15)
BUN: 13 mg/dL (ref 6–20)
CHLORIDE: 101 mmol/L (ref 101–111)
CO2: 29 mmol/L (ref 22–32)
Calcium: 9.7 mg/dL (ref 8.9–10.3)
Creatinine, Ser: 0.98 mg/dL (ref 0.61–1.24)
GFR calc non Af Amer: >60 mL/min (ref >60)
Glucose, Bld: 138 mg/dL — ABNORMAL HIGH (ref 65–99)
POTASSIUM: 3.8 mmol/L (ref 3.5–5.1)
SODIUM: 138 mmol/L (ref 135–145)

## 2017-02-09 LAB — CBC
HCT: 44.9 % (ref 40.0–52.0)
Hemoglobin: 15.2 g/dL (ref 13.0–18.0)
MCH: 28.3 pg (ref 26.0–34.0)
MCHC: 33.9 g/dL (ref 32.0–36.0)
MCV: 83.5 fL (ref 80.0–100.0)
Platelets: 393 10*3/uL (ref 150–440)
RBC: 5.37 MIL/uL (ref 4.40–5.90)
RDW: 13.1 % (ref 11.5–14.5)
WBC: 14.7 10*3/uL — ABNORMAL HIGH (ref 3.8–10.6)

## 2017-02-09 MED ORDER — ASPIRIN 81 MG PO CHEW
324.0000 mg | CHEWABLE_TABLET | Freq: Once | ORAL | Status: AC
  Administered 2017-02-09: 324 mg via ORAL
  Filled 2017-02-09: qty 4

## 2017-02-09 NOTE — ED Triage Notes (Addendum)
Pt presents to ED with left sided chest pressure. Sudden onset while walking on treadmill at the gym around 1730-1800 this evening. Pt states he went home to rest but pain did not go away. Pt denies similar symptoms previously. Denies sob, or nausea.

## 2017-02-09 NOTE — Discharge Instructions (Signed)
Please seek medical attention for any high fevers, chest pain, shortness of breath, change in behavior, persistent vomiting, bloody stool or any other new or concerning symptoms.  

## 2017-02-09 NOTE — ED Provider Notes (Signed)
[facility] Emergency Department Provider Note   ____________________________________________   I have reviewed the triage vital signs and the nursing notes.   HISTORY  Chief Complaint Chest Pain   History limited by: Not Limited   HPI Gary Fleming is a 36 y.o. male who presents to the emergency department today because of chest pain. Is located in the left upper chest. It is described as dull pain. It started roughly 5 hours ago while the patient was on a treadmill. He states he had been lifting weights prior to that. The patient states the pain has been constant. It has not been associated by any shortness of breath. No diaphoresis. Patient denies any recent illnesses. Denies symptoms in the past. States he does have history of high cholesterol and family history of heart disease.    Past Medical History:  Diagnosis Date  . GERD (gastroesophageal reflux disease)   . Overactive bladder     Patient Active Problem List   Diagnosis Date Noted  . Snoring 06/29/2016  . Leukocytosis 02/23/2016  . Difficulty sleeping 12/26/2014  . STD exposure 12/26/2014  . Health care maintenance 12/26/2014  . Hypercholesterolemia 03/09/2014  . Skin lesion 03/09/2014  . Diarrhea 09/05/2013  . Overactive bladder 08/27/2012    History reviewed. No pertinent surgical history.  Prior to Admission medications   Medication Sig Start Date End Date Taking? Authorizing Provider  atorvastatin (LIPITOR) 10 MG tablet take 1 tablet by mouth once daily 11/26/16   Dale DurhamScott, Charlene, MD  fluticasone [facility](FLONASE) 50 MCG/ACT nasal spray Place 2 sprays into both nostrils daily. 09/02/13   Dale DurhamScott, Charlene, MD  Multiple Vitamin (MULTIVITAMIN) tablet Take 1 tablet by mouth daily.    [provider]    Allergies Patient has no known allergies.  Family History  Problem Relation Age of Onset  . Prostate cancer Paternal Grandfather   . Heart disease Paternal Grandfather   .  Prostate cancer Paternal Uncle   . Lung cancer Maternal Grandmother   . Hypertension Mother   . Hypercholesterolemia Mother   . Diabetes Mother   . Hypertension Father   . Hypercholesterolemia Father   . Colon cancer Neg Hx     Social History Social History  Substance Use Topics  . Smoking status: Never Smoker  . Smokeless tobacco: Current User    Types: Chew  . Alcohol use 0.0 oz/week     Comment: three to four beers on the weekend    Review of Systems Constitutional: No fever/chills Eyes: No visual changes. ENT: No sore throat. Cardiovascular:Positive for chest pain. Respiratory: Denies shortness of breath. Gastrointestinal: No abdominal pain.  No nausea, no vomiting.  No diarrhea.   Genitourinary: Negative for dysuria. Musculoskeletal: Negative for back pain. Skin: Negative for rash. Neurological: Negative for headaches, focal weakness or numbness.  ____________________________________________   PHYSICAL EXAM:  VITAL SIGNS: ED Triage Vitals  Enc Vitals Group     BP 02/09/17 2059 (!) 145/85     Pulse Rate 02/09/17 2059 98     Resp 02/09/17 2059 18     Temp 02/09/17 2059 98.4 F (36.9 C)     Temp Source 02/09/17 2059 Oral     SpO2 02/09/17 2059 98 %     Weight 02/09/17 2100 285 lb (129.3 kg)     Height 02/09/17 2100 5\' 11"  (1.803 m)     Head Circumference --      Peak Flow --      Pain Score 02/09/17 2059  3    Constitutional: Alert and oriented. Well appearing and in no distress. Eyes: Conjunctivae are normal.  ENT   Head: Normocephalic and atraumatic.   Nose: No congestion/rhinnorhea.   Mouth/Throat: Mucous membranes are moist.   Neck: No stridor. Hematological/Lymphatic/Immunilogical: No cervical lymphadenopathy. Cardiovascular: Normal rate, regular rhythm.  No murmurs, rubs, or gallops.  Respiratory: Normal respiratory effort without tachypnea nor retractions. Breath sounds are clear and equal bilaterally. No  wheezes/rales/rhonchi. Gastrointestinal: Soft and non tender. No rebound. No guarding.  Genitourinary: Deferred Musculoskeletal: Normal range of motion in all extremities. No lower extremity edema. Neurologic:  Normal speech and language. No gross focal neurologic deficits are appreciated.  Skin:  Skin is warm, dry and intact. No rash noted. Psychiatric: Mood and affect are normal. Speech and behavior are normal. Patient exhibits appropriate insight and judgment.  ____________________________________________    LABS (pertinent positives/negatives)  Labs Reviewed  BASIC METABOLIC PANEL - Abnormal; Notable for the following:       Result Value   Glucose, Bld 138 (*)    All other components within normal limits  CBC - Abnormal; Notable for the following:    WBC 14.7 (*)    All other components within normal limits  TROPONIN I  TROPONIN I     ____________________________________________   EKG  I, Phineas Semen, attending physician, personally viewed and interpreted this EKG  EKG Time: 2105 Rate: 101 Rhythm: sinus tachycardia Axis: normal Intervals: qtc 435 QRS: narrow ST changes: no st elevation Impression: abnormal ekg   ____________________________________________    RADIOLOGY  CXR IMPRESSION: No abnormality noted.   ____________________________________________   PROCEDURES  Procedures  ____________________________________________   INITIAL IMPRESSION / ASSESSMENT AND PLAN / ED COURSE  Pertinent labs & imaging results that were available during my care of the patient were reviewed by me and considered in my medical decision making (see chart for details).  Patient presented to the emergency department today after an episode of chest pain while in the treadmill. Patient does have some risk factors including family history as well as high cholesterol. Initial troponin negative. Will get  repeat.  ____________________________________________   FINAL CLINICAL IMPRESSION(S) / ED DIAGNOSES  Final diagnoses:  Chest pain, unspecified type     Note: This dictation was prepared with Dragon dictation. Any transcriptional errors that result from this process are unintentional     Phineas Semen, MD 02/09/17 2330

## 2017-02-10 ENCOUNTER — Telehealth: Payer: Self-pay | Admitting: *Deleted

## 2017-02-10 DIAGNOSIS — R079 Chest pain, unspecified: Secondary | ICD-10-CM

## 2017-02-10 LAB — TROPONIN I

## 2017-02-10 NOTE — Telephone Encounter (Signed)
Left message for patient to return call to office. 

## 2017-02-10 NOTE — ED Notes (Signed)
Pt. Verbalizes understanding of d/c instructions and follow-up. VS stable and pain controlled per pt.  Pt. In NAD at time of d/c and denies further concerns regarding this visit. Pt. Stable at the time of departure from the unit, departing unit by the safest and most appropriate manner per that pt condition and limitations. Pt advised to return to the ED at any time for emergent concerns, or for new/worsening symptoms.   

## 2017-02-10 NOTE — Telephone Encounter (Signed)
-----   Message from Charlene Scott, MD sent at 02/10/2017  6:30 AM EDT ----- Regarding: cardiology appt Received notification that pt was seen in ER 02/09/17 for chest pain.  Do you mind calling and following up with pt on how he is doing.  Given pain, I would like for him to see cardiology (if not already arranged).  Let me know that I am not in the office this week.  I can place order for referral to cardiology if agreeable.  Thanks    Dr Scott 

## 2017-02-10 NOTE — Telephone Encounter (Signed)
-----   Message from Dale Durhamharlene Scott, MD sent at 02/10/2017  6:30 AM EDT ----- Regarding: cardiology appt Received notification that pt was seen in ER 02/09/17 for chest pain.  Do you mind calling and following up with pt on how he is doing.  Given pain, I would like for him to see cardiology (if not already arranged).  Let me know that I am not in the office this week.  I can place order for referral to cardiology if agreeable.  Thanks    Dr Lorin PicketScott

## 2017-02-10 NOTE — Telephone Encounter (Signed)
Patient stated at times he still has the dull ache in his chest and yes he very interested in a follow up with cardiology and no referral was given at ED. Advised patient referral would be placed and we would call as soon as scheduled and advise. Also advised patient is symptoms worsened to return to ED .

## 2017-02-11 ENCOUNTER — Telehealth: Payer: Self-pay

## 2017-02-11 NOTE — Telephone Encounter (Signed)
See note.  Pt needs appt with cardiology asap.  Went to ER for chest pain.  Still with some issues.  Wants evaluation by cardiology.  Needs appt asap.  Let me know if a problem.

## 2017-02-11 NOTE — Telephone Encounter (Signed)
Called to schedule new patient appt from referral from pcp patient has been to [facility] ed for continued chest pain  Next available appt in September referral marked as urgent .  Sent msg to Dr. Mariah MillingGollan  to see if we can see sooner on 6/21 or 6/22 at 320 per nancy .

## 2017-02-12 ENCOUNTER — Encounter: Payer: Self-pay | Admitting: Internal Medicine

## 2017-02-12 NOTE — Telephone Encounter (Signed)
[facility] has tried calling him to schedule. They could not get a hold of him. They will keep trying to contact him.

## 2017-02-13 ENCOUNTER — Encounter: Payer: Self-pay | Admitting: Internal Medicine

## 2017-02-13 NOTE — Telephone Encounter (Signed)
Thank you for your help.

## 2017-02-13 NOTE — Telephone Encounter (Signed)
They have him scheduled Monday June 25 to see Dr. Okey DupreEnd.

## 2017-02-13 NOTE — Telephone Encounter (Signed)
Patient scheduled with [facility] 6/25 at 3 . Ok per [employer] .

## 2017-02-15 ENCOUNTER — Encounter: Payer: Self-pay | Admitting: Internal Medicine

## 2017-02-15 NOTE — Telephone Encounter (Signed)
Did not receive message until Sunday 02/15/17.  Called pt.  Unable to reach.  Left him a message.  Please call and let him know that I was not in the office last week.  Did not receive this latest message until Sunday 02/15/17.  He has an appt with cardiology Monday 02/16/17 (per note review).  Please confirm aware of appt and doing ok.  Let me know if needs anything.

## 2017-02-16 ENCOUNTER — Encounter: Payer: Self-pay | Admitting: Nurse Practitioner

## 2017-02-16 ENCOUNTER — Ambulatory Visit (INDEPENDENT_AMBULATORY_CARE_PROVIDER_SITE_OTHER): Payer: BC Managed Care – PPO | Admitting: Nurse Practitioner

## 2017-02-16 VITALS — BP 150/80 | HR 96 | Ht 71.0 in | Wt 297.2 lb

## 2017-02-16 DIAGNOSIS — R0602 Shortness of breath: Secondary | ICD-10-CM

## 2017-02-16 DIAGNOSIS — R030 Elevated blood-pressure reading, without diagnosis of hypertension: Secondary | ICD-10-CM | POA: Diagnosis not present

## 2017-02-16 DIAGNOSIS — E782 Mixed hyperlipidemia: Secondary | ICD-10-CM

## 2017-02-16 DIAGNOSIS — R079 Chest pain, unspecified: Secondary | ICD-10-CM

## 2017-02-16 NOTE — Progress Notes (Signed)
Cardiology Clinic Note   Patient Name: Gary Fleming Date of Encounter: 02/16/2017  Primary Care Provider:  Dale [place], MD Primary Cardiologist:  New   Patient Profile    36 y/o ? with a h/o HL, obesity, and GERD, who was recently evaluated in th eED secondary to chest pain.  Past Medical History    Past Medical History:  Diagnosis Date  . Chest discomfort   . Dyspnea on exertion   . GERD (gastroesophageal reflux disease)   . Hyperlipidemia   . Morbid obesity (HCC)   . Overactive bladder    History reviewed. No pertinent surgical history.  Allergies  No Known Allergies  History of Present Illness    36 y/o ? with a h/o HL, GERD, and obesity.  He does have a FH of premature CAD with his paternal grandfather suffering an MI in his early 67's.  He also notes that he has had elevated BPs @ clinic visits before, but that repeat pressures during the same visit are usually normal.  He lives locally with family.  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  He does exercise, doing some wt training and treadmill work on a regular basis.  He was in his usual state of health until 6/36 when after completing a back workout followed by walking on the treadmill, he noted a mild, midsternal, dull ache w/o associated symptoms.  This persisted after going home and after about 5 hrs of ongoing symptoms, he presented to the [facility] ED.  There, ECG was normal and troponin was normal x 2.  CXR was nl.  Pain eventually resolved and he was discharged home. Since then, he has noted some DOE when @ work, esp when having to walk up inclines underneath bridges.  He has also noted intermittent substernal chest aching.  Chest discomfort may last hours at a time, and resolve spontaneously.  Though both chest discomfort and dyspnea might cause him to slow his pace @ work, it does not prevent him from completing his tasks.  Due to ongoing, intermittent symptoms, he was  referred for further cardiology eval.  He is pain free today.  He denies palpitations, pnd, orthopnea, n, v, dizziness, syncope, edema, weight gain, or early satiety.   Home Medications    Prior to Admission medications   Medication Sig Start Date End Date Taking? Authorizing Provider  atorvastatin (LIPITOR) 10 MG tablet take 1 tablet by mouth once daily 11/26/16   Dale Stafford, MD  fluticasone [facility]) 50 MCG/ACT nasal spray Place 2 sprays into both nostrils daily. 09/02/13   Dale [place], MD  Multiple Vitamin (MULTIVITAMIN) tablet Take 1 tablet by mouth daily.    [provider]    Family History    Family History  Problem Relation Age of Onset  . Prostate cancer Paternal Grandfather        MI @ 74.  Marland Kitchen Heart disease Paternal Grandfather   . Prostate cancer Paternal Uncle   . Lung cancer Maternal Grandmother   . Hypertension Mother        currently in her 15's  . Hypercholesterolemia Mother   . Diabetes Mother   . Hypertension Father        currently in his 30's  . Hypercholesterolemia Father   . Heart disease Maternal Grandfather        MI in his 6's  . Colon cancer Neg Hx     Social History  Social History   Social History  . Marital status: Single    Spouse name: N/A  . Number of children: N/A  . Years of education: N/A   Occupational History  . [employer] banker, operationalnspector [employer]   Social History Main Topics  . Smoking status: Never Smoker  . Smokeless tobacco: Current User    Types: Chew  . Alcohol use 0.0 oz/week     Comment: three to four beers a month.  . Drug use: No  . Sexual activity: Not on file   Other Topics Concern  . Not on file   Social History Narrative   [place]ives in [place] with family.  Single.  No children.  Does routinely exercise - wt training, treadmill.     Review of Systems    General:  No chills, fever, night sweats or weight changes.  Cardiovascular:  +++ exertional dull chest pain, +++ mild dyspnea on  exertion, no edema, orthopnea, palpitations, paroxysmal nocturnal dyspnea. Dermatological: No rash, lesions/masses Respiratory: No cough, +++ dyspnea Urologic: No hematuria, dysuria Abdominal:   No nausea, vomiting, diarrhea, bright red blood per rectum, melena, or hematemesis Neurologic:  No visual changes, wkns, changes in mental status. All other systems reviewed and are otherwise negative except as noted above.  Physical Exam    VS:  BP (!) 150/80 (BP Location: Left Arm, Patient Position: Sitting, Cuff Size: Large)   Pulse 96   Ht 5\' 11"  (1.803 m)   Wt 297 lb 4 oz (134.8 kg)   BMI 41.46 kg/m  , BMI Body mass index is 41.46 kg/m. GEN: Well nourished, well developed, in no acute distress.  HEENT: normal.  Neck: Supple, no JVD, carotid bruits, or masses. Cardiac: RRR, no murmurs, rubs, or gallops. No clubbing, cyanosis, edema.  Radials/DP/PT 2+ and equal bilaterally.  Respiratory:  Respirations regular and unlabored, clear to auscultation bilaterally. GI: Soft, nontender, nondistended, BS + x 4. MS: no deformity or atrophy. Skin: warm and dry, no rash. Neuro:  Strength and sensation are intact. Psych: Normal affect.  Accessory Clinical Findings    ECG - RSR, 98, non-specific t changes.  Assessment & Plan   1.  Midsternal chest discomfort:  Pt presents with a 1 wk h/o intermittent rest and exertional chest discomfort associated with mild dyspnea on exertion.  Symptoms do not generally prevent him from doing what he is doing, but he does slow down.  He was seen in the ED on 6/18 for c/p and ecg/cxr/troponins were normal.  He has had intermittent symptoms since then, sometimes lasting hours @ a time. ECG is nl today.  Plan for ETT to r/o ischemia.  I will also obtain an echo to evaluate LV fxn.  2.  DOE:  As above, I will f/u an echo.  No murmurs or evidence of volume overload on exam.  3.  Elevated BP:  Pt suggests that he might have a h/o white coat HTN.  BP elevated on repeat  today - 144/84.  He is overweight and notes that he typically has a lot of salt in his diet.  He does not have a cuff @ home.  I suggested that he obtain a cuff to follow his BP's @ home.  If pressure is consistently > 130 mmHg, he is to contact either us or his PCP, as I would have a low threshold to initiate antihypertensive therapy.  4.  HL:  LDL 103 on low dose lipitor.  Followed by primary care.  5.  Morbid Obesity:  He is exercising regularly and has recently been watching what he is eating.  He will benefit from ongoing calorie restriction and wt loss.  6.  Disposition:  F/u ETT and echo.  F/u in clinic in 3-4 wks after testing complete.  Nicolasa Ducking, NP 02/16/2017, 3:48 PM

## 2017-02-16 NOTE — Patient Instructions (Addendum)
Medication Instructions:  Your physician recommends that you continue on your current medications as directed. Please refer to the Current Medication list given to you today.   Labwork: none  Testing/Procedures: Your physician has requested that you have an exercise tolerance test. For further information please visit [email or web address]. Please also follow instruction sheet, as given.   Your physician has requested that you have an echocardiogram. Echocardiography is a painless test that uses sound waves to create images of your heart. It provides your doctor with information about the size and shape of your heart and how well your heart's chambers and valves are working. This procedure takes approximately one hour. There are no restrictions for this procedure.      Follow-Up: Your physician recommends that you schedule a follow-up appointment in: 1 MONTH WITH CHRIS BERGE, NP.     Exercise Stress Electrocardiogram An exercise stress electrocardiogram is a test to check how blood flows to your heart. It is done to find areas of poor blood flow. You will need to walk on a treadmill for this test. The electrocardiogram will record your heartbeat when you are at rest and when you are exercising. What happens before the procedure?  Do not have drinks with caffeine or foods with caffeine for 24 hours before the test, or as told by your doctor. This includes coffee, tea (even decaf tea), sodas, chocolate, and cocoa.  Follow your doctor's instructions about eating and drinking before the test.  Ask your doctor what medicines you should or should not take before the test. Take your medicines with water unless told by your doctor not to.  If you use an inhaler, bring it with you to the test.  Bring a snack to eat after the test.  Do not  smoke for 4 hours before the test.  Do not put lotions, powders, creams, or oils on your chest before the test.  Wear comfortable shoes and  clothing. What happens during the procedure?  You will have patches put on your chest. Small areas of your chest may need to be shaved. Wires will be connected to the patches.  Your heart rate will be watched while you are resting and while you are exercising.  You will walk on the treadmill. The treadmill will slowly get faster to raise your heart rate.  The test will take about 1-2 hours. What happens after the procedure?  Your heart rate and blood pressure will be watched after the test.  You may return to your normal diet, activities, and medicines or as told by your doctor. This information is not intended to replace advice given to you by your health care provider. Make sure you discuss any questions you have with your health care provider. Document Released: 01/28/2008 Document Revised: 04/09/2016 Document Reviewed: 04/18/2013 Elsevier Interactive Patient Education  2018 [employer]    Echocardiogram An echocardiogram, or echocardiography, uses sound waves (ultrasound) to produce an image of your heart. The echocardiogram is simple, painless, obtained within a short period of time, and offers valuable information to your health care provider. The images from an echocardiogram can provide information such as:  Evidence of coronary artery disease (CAD).  Heart size.  Heart muscle function.  Heart valve function.  Aneurysm detection.  Evidence of a past heart attack.  Fluid buildup around the heart.  Heart muscle thickening.  Assess heart valve function.  Tell a health care provider about:  Any allergies you have.  All medicines you are taking, including vitamins,  herbs, eye drops, creams, and over-the-counter medicines.  Any problems you or family members have had with anesthetic medicines.  Any blood disorders you have.  Any surgeries you have had.  Any medical conditions you have.  Whether you are pregnant or may be pregnant. What happens before the  procedure? No special preparation is needed. Eat and drink normally. What happens during the procedure?  In order to produce an image of your heart, gel will be applied to your chest and a wand-like tool (transducer) will be moved over your chest. The gel will help transmit the sound waves from the transducer. The sound waves will harmlessly bounce off your heart to allow the heart images to be captured in real-time motion. These images will then be recorded.  You may need an IV to receive a medicine that improves the quality of the pictures. What happens after the procedure? You may return to your normal schedule including diet, activities, and medicines, unless your health care provider tells you otherwise. This information is not intended to replace advice given to you by your health care provider. Make sure you discuss any questions you have with your health care provider. Document Released: 08/08/2000 Document Revised: 03/29/2016 Document Reviewed: 04/18/2013 Elsevier Interactive Patient Education  2017 [employer].

## 2017-03-03 ENCOUNTER — Telehealth: Payer: Self-pay | Admitting: Nurse Practitioner

## 2017-03-03 NOTE — Telephone Encounter (Signed)
Pt confirmed tomorrow's 8:30 echo and 9:30 GXT Reviewed instructions and appropriate footwear. Pt verbalized understanding.

## 2017-03-03 NOTE — Telephone Encounter (Signed)
Attempted to contact pt regarding 7/11 GXT. Left message on machine for patient to contact the office.

## 2017-03-04 ENCOUNTER — Ambulatory Visit (INDEPENDENT_AMBULATORY_CARE_PROVIDER_SITE_OTHER): Payer: BC Managed Care – PPO

## 2017-03-04 ENCOUNTER — Other Ambulatory Visit: Payer: Self-pay

## 2017-03-04 DIAGNOSIS — R079 Chest pain, unspecified: Secondary | ICD-10-CM | POA: Diagnosis not present

## 2017-03-04 DIAGNOSIS — R0602 Shortness of breath: Secondary | ICD-10-CM | POA: Diagnosis not present

## 2017-03-04 LAB — ECHOCARDIOGRAM COMPLETE
AO mean calculated velocity dopler: 71.7 cm/s
AV Area VTI index: 1.28 cm2/m2
AV area mean vel ind: 1.35 cm2/m2
AVAREAMEANV: 3.38 cm2
AVCELMEANRAT: 0.81
AVG: 2 mmHg
Ao-asc: 28 cm
Area-P 1/2: 4.15 cm2
CHL CUP AV VALUE AREA INDEX: 1.28
CHL CUP AV VEL: 3.19
CHL CUP DOP CALC LVOT VTI: 17.3 cm
E decel time: 180 msec
EERAT: 5.24
FS: 31 % (ref 28–44)
IV/PV OW: 1
LA ID, A-P, ES: 33 mm
LA vol: 42.8 mL
LADIAMINDEX: 1.32 cm/m2
LAVOLA4C: 40.2 mL
LAVOLIN: 17.1 mL/m2
LDCA: 4.15 cm2
LEFT ATRIUM END SYS DIAM: 33 mm
LV E/e' medial: 5.24
LV E/e'average: 5.24
LV TDI E'MEDIAL: 11.1
LVELAT: 13.6 cm/s
LVOT diameter: 23 mm
LVOTSV: 72 mL
LVOTVTI: 0.77 cm
Lateral S' vel: 12.8 cm/s
MV Dec: 180
MV pk A vel: 47.5 m/s
MV pk E vel: 71.3 m/s
MVPG: 2 mmHg
P 1/2 time: 53 ms
PW: 11 mm — AB (ref 0.6–1.1)
TAPSE: 21.7 mm
TDI e' lateral: 13.6
VTI: 22.5 cm
Valve area: 3.19 cm2

## 2017-03-15 NOTE — Progress Notes (Deleted)
Cardiology Office Note  Date:  03/15/2017   ID:  Gary Fleming, DOB November 27, 1980, MRN [id number]  PCP:  Dale Triana, MD   No chief complaint on file.   HPI:  36 y/o ? 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   does exercise, doing some wt training and treadmill work on a regular basis.   usual state of health until 6/18 when after completing a back workout followed by walking on the treadmill, he noted a mild, midsternal, dull ache w/o associated symptoms.  This persisted after going home and after about 5 hrs of ongoing symptoms, he presented to the [facility] ED.  There, ECG was normal and troponin was normal x 2.  CXR was nl.  Pain eventually resolved and he was discharged home  Echocardiogram 03/04/2017 Normal study, ejection fraction greater than 60%   Normal exercise stress test 03/04/2017  He is on Lipitor Total cholesterol 166, LDL 103     PMH:   has a past medical history of Chest discomfort; Dyspnea on exertion; GERD (gastroesophageal reflux disease); Hyperlipidemia; Morbid obesity (HCC); and Overactive bladder.  PSH:   No past surgical history on file.  Current Outpatient Prescriptions  Medication Sig Dispense Refill  . atorvastatin (LIPITOR) 10 MG tablet take 1 tablet by mouth once daily 30 tablet 5  . fluticasone (FLONASE) 50 MCG/ACT nasal spray Place 2 sprays into both nostrils daily. 16 g 2  . Multiple Vitamin (MULTIVITAMIN) tablet Take 1 tablet by mouth daily.     No current facility-administered medications for this visit.      Allergies:   Patient has no known allergies.   Social History:  The patient  reports that he has never smoked. His smokeless tobacco use includes Chew. He reports that he drinks alcohol. He reports that he does not use drugs.   Family History:   family history  includes Diabetes in his mother; Heart disease in his maternal grandfather and paternal grandfather; Hypercholesterolemia in his father and mother; Hypertension in his father and mother; Lung cancer in his maternal grandmother; Prostate cancer in his paternal grandfather and paternal uncle.    Review of Systems: ROS   PHYSICAL EXAM: VS:  There were no vitals taken for this visit. , BMI There is no height or weight on file to calculate BMI. GEN: Well nourished, well developed, in no acute distress HEENT: normal Neck: no JVD, carotid bruits, or masses Cardiac: RRR; no murmurs, rubs, or gallops,no edema  Respiratory:  clear to auscultation bilaterally, normal work of breathing GI: soft, nontender, nondistended, + BS MS: no deformity or atrophy Skin: warm and dry, no rash Neuro:  Strength and sensation are intact Psych: euthymic mood, full affect    Recent Labs: 01/13/2017: ALT 37; TSH 2.83 02/09/2017: BUN 13; Creatinine, Ser 0.98; Hemoglobin 15.2; Platelets 393; Potassium 3.8; Sodium 138    Lipid Panel Lab Results  Component Value Date   CHOL 166 01/13/2017   HDL 43.30 01/13/2017   LDLCALC 103 (H) 01/13/2017   TRIG 101.0 01/13/2017      Wt Readings from Last 3 Encounters:  02/16/17 297 lb 4 oz (134.8 kg)  02/09/17 285 lb (129.3 kg)  12/30/16 (!) 301 lb 3.2 oz (136.6 kg)       ASSESSMENT AND  PLAN:  No diagnosis found.   Disposition:   F/U  6 months  No orders of the defined types were placed in this encounter.    Signed, Dossie Arbourim Shayn Madole, M.D., Ph.D. 03/15/2017  [facility], [place] [phone]

## 2017-03-17 ENCOUNTER — Ambulatory Visit: Payer: BC Managed Care – PPO | Admitting: Cardiovascular Disease

## 2017-03-17 LAB — EXERCISE TOLERANCE TEST
CHL CUP MPHR: 185 {beats}/min
CHL CUP RESTING HR STRESS: 111 {beats}/min
Estimated workload: 11.3 METS
Exercise duration (min): 9 min
Exercise duration (sec): 47 s
Peak HR: 190 {beats}/min
Percent HR: 102 %

## 2017-06-01 ENCOUNTER — Other Ambulatory Visit: Payer: Self-pay | Admitting: Internal Medicine

## 2017-07-03 ENCOUNTER — Ambulatory Visit (INDEPENDENT_AMBULATORY_CARE_PROVIDER_SITE_OTHER): Payer: BC Managed Care – PPO | Admitting: Internal Medicine

## 2017-07-03 ENCOUNTER — Other Ambulatory Visit: Payer: Self-pay

## 2017-07-03 ENCOUNTER — Other Ambulatory Visit: Payer: Self-pay | Admitting: Internal Medicine

## 2017-07-03 ENCOUNTER — Encounter: Payer: Self-pay | Admitting: Internal Medicine

## 2017-07-03 VITALS — BP 140/82 | HR 86 | Temp 98.2°F | Resp 14 | Ht 71.0 in | Wt 294.0 lb

## 2017-07-03 DIAGNOSIS — R030 Elevated blood-pressure reading, without diagnosis of hypertension: Secondary | ICD-10-CM

## 2017-07-03 DIAGNOSIS — Z23 Encounter for immunization: Secondary | ICD-10-CM

## 2017-07-03 DIAGNOSIS — D72829 Elevated white blood cell count, unspecified: Secondary | ICD-10-CM | POA: Diagnosis not present

## 2017-07-03 DIAGNOSIS — I10 Essential (primary) hypertension: Secondary | ICD-10-CM

## 2017-07-03 DIAGNOSIS — K219 Gastro-esophageal reflux disease without esophagitis: Secondary | ICD-10-CM

## 2017-07-03 DIAGNOSIS — Z0000 Encounter for general adult medical examination without abnormal findings: Secondary | ICD-10-CM | POA: Diagnosis not present

## 2017-07-03 DIAGNOSIS — E7800 Pure hypercholesterolemia, unspecified: Secondary | ICD-10-CM

## 2017-07-03 MED ORDER — ESOMEPRAZOLE MAGNESIUM 40 MG PO CPDR: 40.0000 mg | DELAYED_RELEASE_CAPSULE | Freq: Every day | ORAL | 2 refills | Status: DC

## 2017-07-03 MED ORDER — LISINOPRIL 10 MG PO TABS: 10.0000 mg | ORAL_TABLET | Freq: Every day | ORAL | 1 refills | Status: DC

## 2017-07-03 NOTE — Progress Notes (Signed)
Patient ID: Gary MacMatthew Y Art, male   DOB: [birth year], 36 y.o.   MRN: [id number]   Subjective:    Patient ID: Gary Fleming, male    DOB: [birth year], 36 y.o.   MRN: [id number]  HPI  Patient here for his physical exam.  States he is doing relatively well.  Was seen in the ER previously.  Was having chest discomfort.  Had normal ETT and ECHO.  Saw cardiology.  Feels discomfort was related to acid reflux.  On nexium 20mg  q day for 2 months.  Symptoms have improved.  Still with some burning.  No swallowing difficulty.  No chest pain now.  No sob.  No acid reflux.  No abdominal pain.  Bowels moving.  He is exercising.  Doing cardio - one hour per day.     Past Medical History:  Diagnosis Date  . Chest discomfort   . Dyspnea on exertion   . GERD (gastroesophageal reflux disease)   . Hyperlipidemia   . Morbid obesity (HCC)   . Overactive bladder    History reviewed. No pertinent surgical history. Family History  Problem Relation Age of Onset  . Prostate cancer Paternal Grandfather        MI @ 1140.  Marland Kitchen. Heart disease Paternal Grandfather   . Prostate cancer Paternal Uncle   . Lung cancer Maternal Grandmother   . Hypertension Mother        currently in her 7250's  . Hypercholesterolemia Mother   . Diabetes Mother   . Hypertension Father        currently in his 450's  . Hypercholesterolemia Father   . Heart disease Maternal Grandfather        MI in his 5770's  . Colon cancer Neg Hx    Social History   Socioeconomic History  . Marital status: Single    Spouse name: None  . Number of children: None  . Years of education: None  . Highest education level: None  Social Needs  . Financial resource strain: None  . Food insecurity - worry: None  . Food insecurity - inability: None  . Transportation needs - medical: None  . Transportation needs - non-medical: None  Occupational History  . Occupation: Building control surveyorBridge Inspector    Employer: [employer]  Tobacco Use  . Smoking status:  Never Smoker  . Smokeless tobacco: Current User    Types: Chew  Substance and Sexual Activity  . Alcohol use: Yes    Alcohol/week: 0.0 oz    Comment: three to four beers a month.  . Drug use: No  . Sexual activity: None  Other Topics Concern  . None  Social History Narrative   [place]ives in [place] with family.  Single.  No children.  Does routinely exercise - wt training, treadmill.    Outpatient Encounter Medications as of 07/03/2017  Medication Sig  . atorvastatin (LIPITOR) 10 MG tablet TAKE 1 TABLET BY MOUTH ONCE DAILY  . cetirizine (ZYRTEC) 10 MG tablet Take 10 mg daily by mouth.  . Multiple Vitamin (MULTIVITAMIN) tablet Take 1 tablet by mouth daily.  . [DISCONTINUED] esomeprazole (NEXIUM) 20 MG capsule Take 20 mg daily at 12 noon by mouth.  . [DISCONTINUED] fluticasone (FLONASE) 50 MCG/ACT nasal spray Place 2 sprays into both nostrils daily.  Marland Kitchen. esomeprazole (NEXIUM) 40 MG capsule Take 1 capsule (40 mg total) daily by mouth.  Marland Kitchen. lisinopril (PRINIVIL,ZESTRIL) 10 MG tablet Take 1 tablet (10 mg total) daily by mouth.   No  facility-administered encounter medications on file as of 07/03/2017.     Review of Systems  Constitutional: Negative for appetite change and unexpected weight change.  HENT: Negative for congestion and sinus pressure.   Eyes: Negative for pain and visual disturbance.  Respiratory: Negative for cough, chest tightness and shortness of breath.   Cardiovascular: Negative for chest pain, palpitations and leg swelling.  Gastrointestinal: Negative for abdominal pain, diarrhea, nausea and vomiting.       Acid reflux as outlined.    Genitourinary: Negative for difficulty urinating and dysuria.  Musculoskeletal: Negative for back pain and joint swelling.  Skin: Negative for color change and rash.  Neurological: Negative for dizziness, light-headedness and headaches.  Hematological: Negative for adenopathy. Does not bruise/bleed easily.  Psychiatric/Behavioral: Negative  for agitation and dysphoric mood.       Objective:    Physical Exam  Constitutional: He is oriented to person, place, and time. He appears well-developed and well-nourished. No distress.  HENT:  Head: Normocephalic and atraumatic.  Nose: Nose normal.  Mouth/Throat: Oropharynx is clear and moist. No oropharyngeal exudate.  Eyes: Conjunctivae are normal. Right eye exhibits no discharge. Left eye exhibits no discharge.  Neck: Neck supple. No thyromegaly present.  Cardiovascular: Normal rate and regular rhythm.  Pulmonary/Chest: Breath sounds normal. No respiratory distress. He has no wheezes.  Abdominal: Soft. Bowel sounds are normal. There is no tenderness.  Genitourinary:  Genitourinary Comments: Not performed.    Musculoskeletal: He exhibits no edema or tenderness.  Lymphadenopathy:    He has no cervical adenopathy.  Neurological: He is alert and oriented to person, place, and time.  Skin: Skin is warm and dry. No rash noted. No erythema.  Psychiatric: He has a normal mood and affect. His behavior is normal.    BP 140/82 (BP Location: Left Arm, Patient Position: Sitting, Cuff Size: Normal)   Pulse 86   Temp 98.2 F (36.8 C) (Oral)   Resp 14   Ht 5\' 11"  (1.803 m)   Wt 294 lb (133.4 kg)   SpO2 97%   BMI 41.00 kg/m  Wt Readings from Last 3 Encounters:  07/03/17 294 lb (133.4 kg)  02/16/17 297 lb 4 oz (134.8 kg)  02/09/17 285 lb (129.3 kg)     Lab Results  Component Value Date   WBC 14.7 (H) 02/09/2017   HGB 15.2 02/09/2017   HCT 44.9 02/09/2017   PLT 393 02/09/2017   GLUCOSE 138 (H) 02/09/2017   CHOL 166 01/13/2017   TRIG 101.0 01/13/2017   HDL 43.30 01/13/2017   LDLDIRECT 159.0 11/06/2014   LDLCALC 103 (H) 01/13/2017   ALT 37 01/13/2017   AST 21 01/13/2017   NA 138 02/09/2017   K 3.8 02/09/2017   CL 101 02/09/2017   CREATININE 0.98 02/09/2017   BUN 13 02/09/2017   CO2 29 02/09/2017   TSH 2.83 01/13/2017    Dg Chest 2 View  Result Date:  02/09/2017 CLINICAL DATA:  Chest pain EXAM: CHEST  2 VIEW COMPARISON:  None. FINDINGS: Lungs are clear. Heart size and pulmonary vascularity are normal. No adenopathy. No pneumomediastinum or pneumothorax. No bone lesions. IMPRESSION: No abnormality noted. Electronically Signed   By: Bretta BangWilliam  Woodruff III M.D.   On: 02/09/2017 21:23       Assessment & Plan:   Problem List Items Addressed This Visit    GERD (gastroesophageal reflux disease)    On nexium.  Is better.  Persistent symptoms.  Start nexium 40mg  q day.  Follow.  Get him back in soon to reassess.        Relevant Medications   esomeprazole (NEXIUM) 40 MG capsule   Health care maintenance    Physical today 07/06/17.  Sees urology for prostate and genital check.  Follow cholesterol.        Hypercholesterolemia    Low cholesterol diet and exercise.  Follow lipid panel.        Relevant Medications   lisinopril (PRINIVIL,ZESTRIL) 10 MG tablet   Other Relevant Orders   Hepatic function panel   Lipid panel   Basic metabolic panel   Hypertension, essential    Blood pressure remains elevated.  Start lisinopril.  Follow pressures.  Follow metabolic panel.        Relevant Medications   lisinopril (PRINIVIL,ZESTRIL) 10 MG tablet   Leukocytosis    Recheck cbc to confirm normal.        Relevant Orders   CBC with Differential/Platelet    Other Visit Diagnoses    Routine general medical examination at a health care facility    -  Primary   Need for immunization against influenza       Relevant Orders   Flu Vaccine QUAD 36+ mos IM (Completed)   Elevated blood pressure reading           Dale Delphi, MD

## 2017-07-06 ENCOUNTER — Encounter: Payer: Self-pay | Admitting: Internal Medicine

## 2017-07-06 ENCOUNTER — Telehealth: Payer: Self-pay | Admitting: Radiology

## 2017-07-06 DIAGNOSIS — I10 Essential (primary) hypertension: Secondary | ICD-10-CM | POA: Insufficient documentation

## 2017-07-06 DIAGNOSIS — K219 Gastro-esophageal reflux disease without esophagitis: Secondary | ICD-10-CM | POA: Insufficient documentation

## 2017-07-06 NOTE — Telephone Encounter (Signed)
Pt coming in for labs next Tuesday, please place future orders. Thank you.  

## 2017-07-06 NOTE — Assessment & Plan Note (Signed)
Physical today 07/06/17.  Sees urology for prostate and genital check.  Follow cholesterol.

## 2017-07-06 NOTE — Assessment & Plan Note (Signed)
Recheck cbc to confirm normal.   

## 2017-07-06 NOTE — Assessment & Plan Note (Signed)
On nexium.  Is better.  Persistent symptoms.  Start nexium 40mg  q day.  Follow.  Get him back in soon to reassess.

## 2017-07-06 NOTE — Telephone Encounter (Signed)
Orders placed for labs

## 2017-07-06 NOTE — Assessment & Plan Note (Signed)
Blood pressure remains elevated.  Start lisinopril.  Follow pressures.  Follow metabolic panel.

## 2017-07-06 NOTE — Assessment & Plan Note (Signed)
Low cholesterol diet and exercise.  Follow lipid panel.   

## 2017-07-14 ENCOUNTER — Other Ambulatory Visit: Payer: BC Managed Care – PPO

## 2017-07-21 ENCOUNTER — Other Ambulatory Visit (INDEPENDENT_AMBULATORY_CARE_PROVIDER_SITE_OTHER): Payer: BC Managed Care – PPO

## 2017-07-21 DIAGNOSIS — E7800 Pure hypercholesterolemia, unspecified: Secondary | ICD-10-CM

## 2017-07-21 DIAGNOSIS — D72829 Elevated white blood cell count, unspecified: Secondary | ICD-10-CM

## 2017-07-22 LAB — CBC WITH DIFFERENTIAL/PLATELET
BASOS PCT: 1 % (ref 0.0–3.0)
Basophils Absolute: 0.1 10*3/uL (ref 0.0–0.1)
Eosinophils Absolute: 0.4 10*3/uL (ref 0.0–0.7)
Eosinophils Relative: 3.7 % (ref 0.0–5.0)
HEMATOCRIT: 43.3 % (ref 39.0–52.0)
Hemoglobin: 14.6 g/dL (ref 13.0–17.0)
LYMPHS PCT: 27.6 % (ref 12.0–46.0)
Lymphs Abs: 3.2 10*3/uL (ref 0.7–4.0)
MCHC: 33.7 g/dL (ref 30.0–36.0)
MCV: 87.3 fl (ref 78.0–100.0)
MONOS PCT: 8.5 % (ref 3.0–12.0)
Monocytes Absolute: 1 10*3/uL (ref 0.1–1.0)
NEUTROS ABS: 6.8 10*3/uL (ref 1.4–7.7)
Neutrophils Relative %: 59.2 % (ref 43.0–77.0)
Platelets: 405 10*3/uL — ABNORMAL HIGH (ref 150.0–400.0)
RBC: 4.96 Mil/uL (ref 4.22–5.81)
RDW: 13.1 % (ref 11.5–15.5)
WBC: 11.4 10*3/uL — ABNORMAL HIGH (ref 4.0–10.5)

## 2017-07-22 LAB — HEPATIC FUNCTION PANEL
ALT: 42 U/L (ref 0–53)
AST: 30 U/L (ref 0–37)
Albumin: 4.5 g/dL (ref 3.5–5.2)
Alkaline Phosphatase: 85 U/L (ref 39–117)
BILIRUBIN DIRECT: 0.1 mg/dL (ref 0.0–0.3)
BILIRUBIN TOTAL: 0.5 mg/dL (ref 0.2–1.2)
Total Protein: 7.4 g/dL (ref 6.0–8.3)

## 2017-07-22 LAB — BASIC METABOLIC PANEL
BUN: 10 mg/dL (ref 6–23)
CALCIUM: 9.6 mg/dL (ref 8.4–10.5)
CO2: 29 mEq/L (ref 19–32)
Chloride: 101 mEq/L (ref 96–112)
Creatinine, Ser: 0.96 mg/dL (ref 0.40–1.50)
GFR: 94.1 mL/min (ref >60.00)
GLUCOSE: 92 mg/dL (ref 70–99)
Potassium: 4.3 mEq/L (ref 3.5–5.1)
SODIUM: 136 meq/L (ref 135–145)

## 2017-07-22 LAB — LIPID PANEL
CHOL/HDL RATIO: 4
Cholesterol: 141 mg/dL (ref 0–200)
HDL: 38.3 mg/dL — ABNORMAL LOW (ref >39.00)
LDL Cholesterol: 70 mg/dL (ref 0–99)
NonHDL: 102.34
Triglycerides: 163 mg/dL — ABNORMAL HIGH (ref 0.0–149.0)
VLDL: 32.6 mg/dL (ref 0.0–40.0)

## 2017-07-23 ENCOUNTER — Other Ambulatory Visit: Payer: Self-pay | Admitting: Internal Medicine

## 2017-07-23 DIAGNOSIS — D72829 Elevated white blood cell count, unspecified: Secondary | ICD-10-CM

## 2017-07-23 NOTE — Progress Notes (Signed)
Order placed for f/u lab.   

## 2017-08-14 ENCOUNTER — Other Ambulatory Visit (INDEPENDENT_AMBULATORY_CARE_PROVIDER_SITE_OTHER): Payer: BC Managed Care – PPO

## 2017-08-14 DIAGNOSIS — D72829 Elevated white blood cell count, unspecified: Secondary | ICD-10-CM

## 2017-08-14 LAB — CBC WITH DIFFERENTIAL/PLATELET
BASOS PCT: 0.5 % (ref 0.0–3.0)
Basophils Absolute: 0.1 10*3/uL (ref 0.0–0.1)
Eosinophils Absolute: 0.5 10*3/uL (ref 0.0–0.7)
Eosinophils Relative: 4.3 % (ref 0.0–5.0)
HCT: 44.9 % (ref 39.0–52.0)
HEMOGLOBIN: 15.1 g/dL (ref 13.0–17.0)
Lymphocytes Relative: 21.6 % (ref 12.0–46.0)
Lymphs Abs: 2.5 10*3/uL (ref 0.7–4.0)
MCHC: 33.6 g/dL (ref 30.0–36.0)
MCV: 87.4 fl (ref 78.0–100.0)
MONO ABS: 0.7 10*3/uL (ref 0.1–1.0)
Monocytes Relative: 6.4 % (ref 3.0–12.0)
Neutro Abs: 7.8 10*3/uL — ABNORMAL HIGH (ref 1.4–7.7)
Neutrophils Relative %: 67.2 % (ref 43.0–77.0)
PLATELETS: 399 10*3/uL (ref 150.0–400.0)
RBC: 5.14 Mil/uL (ref 4.22–5.81)
RDW: 13.1 % (ref 11.5–15.5)
WBC: 11.6 10*3/uL — AB (ref 4.0–10.5)

## 2017-08-17 ENCOUNTER — Encounter: Payer: Self-pay | Admitting: Internal Medicine

## 2017-09-02 ENCOUNTER — Other Ambulatory Visit: Payer: Self-pay | Admitting: Internal Medicine

## 2017-09-15 ENCOUNTER — Encounter: Payer: Self-pay | Admitting: Internal Medicine

## 2017-09-15 ENCOUNTER — Ambulatory Visit: Payer: BC Managed Care – PPO | Admitting: Internal Medicine

## 2017-09-15 DIAGNOSIS — Z6841 Body Mass Index (BMI) 40.0 and over, adult: Secondary | ICD-10-CM

## 2017-09-15 DIAGNOSIS — D72829 Elevated white blood cell count, unspecified: Secondary | ICD-10-CM | POA: Diagnosis not present

## 2017-09-15 DIAGNOSIS — I10 Essential (primary) hypertension: Secondary | ICD-10-CM | POA: Diagnosis not present

## 2017-09-15 DIAGNOSIS — E7800 Pure hypercholesterolemia, unspecified: Secondary | ICD-10-CM

## 2017-09-15 DIAGNOSIS — R209 Unspecified disturbances of skin sensation: Secondary | ICD-10-CM | POA: Diagnosis not present

## 2017-09-15 NOTE — Progress Notes (Signed)
Patient ID: Gary Fleming, male   DOB: 1981-02-12, 37 y.o.   MRN: [id number]   Subjective:    Patient ID: Gary Fleming, male    DOB: 02-11-1981, 37 y.o.   MRN: [id number]  HPI  Patient here for a scheduled follow up.  He reports he is doing well.  Feels good.  Took nexium for two weeks.  Acid reflux resolved.  No acid reflux now.  Off nexium.  No chest pain.  No sob.  No abdominal pain.  Bowels moving.  States blood pressures averaging 120s/84-85.  Does report having cold hands and feet.  No color change.  No pain.  No stiffness.   Has adjusted his diet.  Lost weight.     Past Medical History:  Diagnosis Date  . Chest discomfort   . Dyspnea on exertion   . GERD (gastroesophageal reflux disease)   . Hyperlipidemia   . Morbid obesity (HCC)   . Overactive bladder    History reviewed. No pertinent surgical history. Family History  Problem Relation Age of Onset  . Prostate cancer Paternal Grandfather        MI @ 6.  Marland Kitchen Heart disease Paternal Grandfather   . Prostate cancer Paternal Uncle   . Lung cancer Maternal Grandmother   . Hypertension Mother        currently in her 49's  . Hypercholesterolemia Mother   . Diabetes Mother   . Hypertension Father        currently in his 76's  . Hypercholesterolemia Father   . Heart disease Maternal Grandfather        MI in his 76's  . Colon cancer Neg Hx    Social History   Socioeconomic History  . Marital status: Single    Spouse name: None  . Number of children: None  . Years of education: None  . Highest education level: None  Social Needs  . Financial resource strain: None  . Food insecurity - worry: None  . Food insecurity - inability: None  . Transportation needs - medical: None  . Transportation needs - non-medical: None  Occupational History  . Occupation: Building control surveyor: [employer]  Tobacco Use  . Smoking status: Never Smoker  . Smokeless tobacco: Current User    Types: Chew    Substance and Sexual Activity  . Alcohol use: Yes    Alcohol/week: 0.0 oz    Comment: three to four beers a month.  . Drug use: No  . Sexual activity: None  Other Topics Concern  . None  Social History Narrative   Lives in [place] with family.  Single.  No children.  Does routinely exercise - wt training, treadmill.    Outpatient Encounter Medications as of 09/15/2017  Medication Sig  . atorvastatin (LIPITOR) 10 MG tablet TAKE 1 TABLET BY MOUTH ONCE DAILY  . cetirizine (ZYRTEC) 10 MG tablet Take 10 mg daily by mouth.  Marland Kitchen lisinopril (PRINIVIL,ZESTRIL) 10 MG tablet TAKE 1 TABLET(10 MG) BY MOUTH DAILY  . Multiple Vitamin (MULTIVITAMIN) tablet Take 1 tablet by mouth daily.  . [DISCONTINUED] esomeprazole (NEXIUM) 40 MG capsule Take 1 capsule (40 mg total) daily by mouth.   No facility-administered encounter medications on file as of 09/15/2017.     Review of Systems  Constitutional: Negative for appetite change and unexpected weight change.  HENT: Negative for congestion and sinus pressure.   Respiratory: Negative for cough, chest tightness and shortness of breath.  Cardiovascular: Negative for chest pain, palpitations and leg swelling.  Gastrointestinal: Negative for abdominal pain, diarrhea, nausea and vomiting.  Genitourinary: Negative for difficulty urinating and dysuria.  Musculoskeletal: Negative for joint swelling and myalgias.  Skin: Negative for color change and rash.  Neurological: Negative for dizziness, light-headedness and headaches.  Psychiatric/Behavioral: Negative for agitation and dysphoric mood.       Objective:    Physical Exam  Constitutional: He appears well-developed and well-nourished. No distress.  HENT:  Nose: Nose normal.  Mouth/Throat: Oropharynx is clear and moist.  Neck: Neck supple. No thyromegaly present.  Cardiovascular: Normal rate and regular rhythm.  Pulmonary/Chest: Effort normal and breath sounds normal. No respiratory distress.   Abdominal: Soft. Bowel sounds are normal. There is no tenderness.  Musculoskeletal: He exhibits no edema or tenderness.  Lymphadenopathy:    He has no cervical adenopathy.  Skin: No rash noted. No erythema.  Psychiatric: He has a normal mood and affect. His behavior is normal.    BP 132/84 (BP Location: Left Arm, Patient Position: Sitting, Cuff Size: Large)   Pulse 68   Temp 98.3 F (36.8 C) (Oral)   Resp 16   Wt 288 lb 3.2 oz (130.7 kg)   SpO2 98%   BMI 40.20 kg/m  Wt Readings from Last 3 Encounters:  09/15/17 288 lb 3.2 oz (130.7 kg)  07/03/17 294 lb (133.4 kg)  02/16/17 297 lb 4 oz (134.8 kg)     Lab Results  Component Value Date   WBC 11.6 (H) 08/14/2017   HGB 15.1 08/14/2017   HCT 44.9 08/14/2017   PLT 399.0 08/14/2017   GLUCOSE 92 07/21/2017   CHOL 141 07/21/2017   TRIG 163.0 (H) 07/21/2017   HDL 38.30 (L) 07/21/2017   LDLDIRECT 159.0 11/06/2014   LDLCALC 70 07/21/2017   ALT 42 07/21/2017   AST 30 07/21/2017   NA 136 07/21/2017   K 4.3 07/21/2017   CL 101 07/21/2017   CREATININE 0.96 07/21/2017   BUN 10 07/21/2017   CO2 29 07/21/2017   TSH 2.83 01/13/2017    Dg Chest 2 View  Result Date: 02/09/2017 CLINICAL DATA:  Chest pain EXAM: CHEST  2 VIEW COMPARISON:  None. FINDINGS: Lungs are clear. Heart size and pulmonary vascularity are normal. No adenopathy. No pneumomediastinum or pneumothorax. No bone lesions. IMPRESSION: No abnormality noted. Electronically Signed   By: Bretta BangWilliam  Woodruff III M.D.   On: 02/09/2017 21:23       Assessment & Plan:   Problem List Items Addressed This Visit    BMI 40.0-44.9, adult (HCC)    Discussed diet and exercise.  Follow.       Cold hands    Pulse - ok.  Discussed possible raynauds.  He desires no further w/up or evaluation at this time.  Follow.        Hypercholesterolemia    On lipitor.  Low cholesterol diet and exercise.  Follow lipid panel and liver function tests.        Relevant Orders   Hepatic function  panel   Lipid panel   Hypertension, essential    Blood pressure under reasonable control.  Continue current medication regimen.  Follow pressures.  Follow metabolic panel.        Relevant Orders   Basic metabolic panel   Leukocytosis    Still slightly elevated on recent check.  Follow.  Stable.       Relevant Orders   CBC with Differential/Platelet  Einar Pheasant, MD

## 2017-09-18 ENCOUNTER — Encounter: Payer: Self-pay | Admitting: Internal Medicine

## 2017-09-18 NOTE — Assessment & Plan Note (Signed)
Still slightly elevated on recent check.  Follow.  Stable.

## 2017-09-18 NOTE — Assessment & Plan Note (Signed)
On lipitor.  Low cholesterol diet and exercise.  Follow lipid panel and liver function tests.   

## 2017-09-18 NOTE — Assessment & Plan Note (Signed)
Blood pressure under reasonable control.  Continue current medication regimen. Follow pressures.  Follow metabolic panel.   

## 2017-09-19 DIAGNOSIS — Z6839 Body mass index (BMI) 39.0-39.9, adult: Secondary | ICD-10-CM | POA: Insufficient documentation

## 2017-09-19 DIAGNOSIS — R209 Unspecified disturbances of skin sensation: Secondary | ICD-10-CM | POA: Insufficient documentation

## 2017-09-19 NOTE — Assessment & Plan Note (Signed)
Discussed diet and exercise.  Follow.  

## 2017-09-19 NOTE — Assessment & Plan Note (Signed)
Pulse - ok.  Discussed possible raynauds.  He desires no further w/up or evaluation at this time.  Follow.

## 2017-10-06 ENCOUNTER — Other Ambulatory Visit: Payer: Self-pay | Admitting: Internal Medicine

## 2017-10-09 ENCOUNTER — Other Ambulatory Visit: Payer: Self-pay | Admitting: Internal Medicine

## 2017-12-14 ENCOUNTER — Other Ambulatory Visit (INDEPENDENT_AMBULATORY_CARE_PROVIDER_SITE_OTHER): Payer: BC Managed Care – PPO

## 2017-12-14 DIAGNOSIS — D72829 Elevated white blood cell count, unspecified: Secondary | ICD-10-CM

## 2017-12-14 DIAGNOSIS — E7800 Pure hypercholesterolemia, unspecified: Secondary | ICD-10-CM | POA: Diagnosis not present

## 2017-12-14 DIAGNOSIS — I10 Essential (primary) hypertension: Secondary | ICD-10-CM

## 2017-12-14 LAB — BASIC METABOLIC PANEL
BUN: 12 mg/dL (ref 6–23)
CALCIUM: 9.1 mg/dL (ref 8.4–10.5)
CO2: 30 mEq/L (ref 19–32)
CREATININE: 0.95 mg/dL (ref 0.40–1.50)
Chloride: 101 mEq/L (ref 96–112)
GFR: 95.03 mL/min (ref >60.00)
Glucose, Bld: 150 mg/dL — ABNORMAL HIGH (ref 70–99)
Potassium: 4.5 mEq/L (ref 3.5–5.1)
Sodium: 138 mEq/L (ref 135–145)

## 2017-12-14 LAB — HEPATIC FUNCTION PANEL
ALK PHOS: 77 U/L (ref 39–117)
ALT: 32 U/L (ref 0–53)
AST: 19 U/L (ref 0–37)
Albumin: 4.2 g/dL (ref 3.5–5.2)
BILIRUBIN DIRECT: 0.1 mg/dL (ref 0.0–0.3)
TOTAL PROTEIN: 7 g/dL (ref 6.0–8.3)
Total Bilirubin: 0.3 mg/dL (ref 0.2–1.2)

## 2017-12-14 LAB — CBC WITH DIFFERENTIAL/PLATELET
BASOS ABS: 0.1 10*3/uL (ref 0.0–0.1)
Basophils Relative: 0.7 % (ref 0.0–3.0)
EOS ABS: 0.5 10*3/uL (ref 0.0–0.7)
Eosinophils Relative: 4.8 % (ref 0.0–5.0)
HCT: 41.6 % (ref 39.0–52.0)
Hemoglobin: 14.4 g/dL (ref 13.0–17.0)
LYMPHS ABS: 2.5 10*3/uL (ref 0.7–4.0)
Lymphocytes Relative: 25.8 % (ref 12.0–46.0)
MCHC: 34.7 g/dL (ref 30.0–36.0)
MCV: 86.2 fl (ref 78.0–100.0)
MONOS PCT: 8 % (ref 3.0–12.0)
Monocytes Absolute: 0.8 10*3/uL (ref 0.1–1.0)
NEUTROS ABS: 5.9 10*3/uL (ref 1.4–7.7)
NEUTROS PCT: 60.7 % (ref 43.0–77.0)
PLATELETS: 379 10*3/uL (ref 150.0–400.0)
RBC: 4.82 Mil/uL (ref 4.22–5.81)
RDW: 13 % (ref 11.5–15.5)
WBC: 9.7 10*3/uL (ref 4.0–10.5)

## 2017-12-14 LAB — LIPID PANEL
CHOLESTEROL: 159 mg/dL (ref 0–200)
HDL: 45.1 mg/dL (ref >39.00)
LDL Cholesterol: 88 mg/dL (ref 0–99)
NonHDL: 113.52
TRIGLYCERIDES: 130 mg/dL (ref 0.0–149.0)
Total CHOL/HDL Ratio: 4
VLDL: 26 mg/dL (ref 0.0–40.0)

## 2017-12-15 ENCOUNTER — Other Ambulatory Visit: Payer: Self-pay | Admitting: Internal Medicine

## 2017-12-15 ENCOUNTER — Encounter: Payer: Self-pay | Admitting: Internal Medicine

## 2017-12-15 ENCOUNTER — Other Ambulatory Visit (INDEPENDENT_AMBULATORY_CARE_PROVIDER_SITE_OTHER): Payer: BC Managed Care – PPO

## 2017-12-15 DIAGNOSIS — R739 Hyperglycemia, unspecified: Secondary | ICD-10-CM

## 2017-12-15 LAB — HEMOGLOBIN A1C: HEMOGLOBIN A1C: 5.7 % (ref 4.6–6.5)

## 2017-12-15 NOTE — Progress Notes (Signed)
Order placed for add on  - a1c.  

## 2017-12-16 ENCOUNTER — Encounter: Payer: Self-pay | Admitting: Internal Medicine

## 2017-12-16 ENCOUNTER — Ambulatory Visit: Payer: BC Managed Care – PPO | Admitting: Internal Medicine

## 2017-12-16 VITALS — BP 112/76 | HR 69 | Temp 97.9°F | Resp 18 | Wt 290.0 lb

## 2017-12-16 DIAGNOSIS — Z6841 Body Mass Index (BMI) 40.0 and over, adult: Secondary | ICD-10-CM

## 2017-12-16 DIAGNOSIS — R0683 Snoring: Secondary | ICD-10-CM

## 2017-12-16 DIAGNOSIS — R739 Hyperglycemia, unspecified: Secondary | ICD-10-CM

## 2017-12-16 DIAGNOSIS — D72829 Elevated white blood cell count, unspecified: Secondary | ICD-10-CM | POA: Diagnosis not present

## 2017-12-16 DIAGNOSIS — I10 Essential (primary) hypertension: Secondary | ICD-10-CM

## 2017-12-16 DIAGNOSIS — E7800 Pure hypercholesterolemia, unspecified: Secondary | ICD-10-CM | POA: Diagnosis not present

## 2017-12-16 NOTE — Assessment & Plan Note (Signed)
Blood pressure as outlined.  Continue same medication regimen.  Follow pressures.  Follow metabolic panel.  

## 2017-12-16 NOTE — Assessment & Plan Note (Signed)
White count normal on recent check.  Follow.

## 2017-12-16 NOTE — Assessment & Plan Note (Signed)
Snores.  Wakes up at night. Wakes up "groggy".  Tries split night sleep study.  Could not sleep.  Is interested in home sleep study.  See if can arrange.

## 2017-12-16 NOTE — Assessment & Plan Note (Signed)
Discussed diet and exercise 

## 2017-12-16 NOTE — Assessment & Plan Note (Signed)
On lipitor.  Low cholesterol diet and exercise.  Follow lipid panel and liver function tests.   

## 2017-12-16 NOTE — Progress Notes (Signed)
Patient ID: ALEXZANDER DOLINGER, male   DOB: 1981-03-13, 37 y.o.   MRN: [id number]   Subjective:    Patient ID: Glennon Mac, male    DOB: 02/06/1981, 37 y.o.   MRN: [id number]  HPI  Patient here for a scheduled follow up.  States he is doing well.  No acid reflux.  Trying to stay active.  No chest pain.  No sob.  No abdominal pain.  Bowels moving.  Discussed lab results.  Cholesterol improved.  Weight relatively stable.  Up a couple of pounds from the last check.  Discussed diet and exercise.  Not having issues with cold hands.  Overall feels good.  States blood pressures are averaging 120-130 systolic readings.     Past Medical History:  Diagnosis Date  . Chest discomfort   . Dyspnea on exertion   . GERD (gastroesophageal reflux disease)   . Hyperlipidemia   . Morbid obesity (HCC)   . Overactive bladder    No past surgical history on file. Family History  Problem Relation Age of Onset  . Prostate cancer Paternal Grandfather        MI @ 75.  Marland Kitchen Heart disease Paternal Grandfather   . Prostate cancer Paternal Uncle   . Lung cancer Maternal Grandmother   . Hypertension Mother        currently in her 107's  . Hypercholesterolemia Mother   . Diabetes Mother   . Hypertension Father        currently in his 54's  . Hypercholesterolemia Father   . Heart disease Maternal Grandfather        MI in his 65's  . Colon cancer Neg Hx    Social History   Socioeconomic History  . Marital status: Significant Other    Spouse name: Not on file  . Number of children: Not on file  . Years of education: Not on file  . Highest education level: Not on file  Occupational History  . Occupation: Building control surveyor: [employer]  Social Needs  . Financial resource strain: Not on file  . Food insecurity:    Worry: Not on file    Inability: Not on file  . Transportation needs:    Medical: Not on file    Non-medical: Not on file  Tobacco Use  . Smoking status: Never  Smoker  . Smokeless tobacco: Current User    Types: Chew  Substance and Sexual Activity  . Alcohol use: Yes    Alcohol/week: 0.0 oz    Comment: three to four beers a month.  . Drug use: No  . Sexual activity: Not on file  Lifestyle  . Physical activity:    Days per week: Not on file    Minutes per session: Not on file  . Stress: Not on file  Relationships  . Social connections:    Talks on phone: Not on file    Gets together: Not on file    Attends religious service: Not on file    Active member of club or organization: Not on file    Attends meetings of clubs or organizations: Not on file    Relationship status: Not on file  Other Topics Concern  . Not on file  Social History Narrative   Lives in [place] with family.  Single.  No children.  Does routinely exercise - wt training, treadmill.    Outpatient Encounter Medications as of 12/16/2017  Medication Sig  . atorvastatin (  LIPITOR) 10 MG tablet TAKE 1 TABLET BY MOUTH ONCE DAILY  . cetirizine (ZYRTEC) 10 MG tablet Take 10 mg daily by mouth.  Marland Kitchen lisinopril (PRINIVIL,ZESTRIL) 10 MG tablet TAKE 1 TABLET(10 MG) BY MOUTH DAILY  . Multiple Vitamin (MULTIVITAMIN) tablet Take 1 tablet by mouth daily.   No facility-administered encounter medications on file as of 12/16/2017.     Review of Systems  Constitutional: Negative for appetite change and unexpected weight change.  HENT: Negative for congestion and sinus pressure.   Respiratory: Negative for cough, chest tightness and shortness of breath.   Cardiovascular: Negative for chest pain, palpitations and leg swelling.  Gastrointestinal: Negative for abdominal pain, diarrhea, nausea and vomiting.  Genitourinary: Negative for difficulty urinating and dysuria.  Musculoskeletal: Negative for joint swelling and myalgias.  Skin: Negative for color change and rash.  Neurological: Negative for dizziness, light-headedness and headaches.  Psychiatric/Behavioral: Negative for agitation  and dysphoric mood.       Objective:     Blood pressure rechecked by me:  112/76  Physical Exam  Constitutional: He appears well-developed and well-nourished. No distress.  HENT:  Nose: Nose normal.  Mouth/Throat: Oropharynx is clear and moist.  Neck: Neck supple. No thyromegaly present.  Cardiovascular: Normal rate and regular rhythm.  Pulmonary/Chest: Effort normal and breath sounds normal. No respiratory distress.  Abdominal: Soft. Bowel sounds are normal. There is no tenderness.  Musculoskeletal: He exhibits no edema or tenderness.  Lymphadenopathy:    He has no cervical adenopathy.  Skin: No rash noted. No erythema.  Psychiatric: He has a normal mood and affect. His behavior is normal.    BP 112/76   Pulse 69   Temp 97.9 F (36.6 C) (Oral)   Resp 18   Wt 290 lb (131.5 kg)   SpO2 98%   BMI 40.45 kg/m  Wt Readings from Last 3 Encounters:  12/16/17 290 lb (131.5 kg)  09/15/17 288 lb 3.2 oz (130.7 kg)  07/03/17 294 lb (133.4 kg)     Lab Results  Component Value Date   WBC 9.7 12/14/2017   HGB 14.4 12/14/2017   HCT 41.6 12/14/2017   PLT 379.0 12/14/2017   GLUCOSE 150 (H) 12/14/2017   CHOL 159 12/14/2017   TRIG 130.0 12/14/2017   HDL 45.10 12/14/2017   LDLDIRECT 159.0 11/06/2014   LDLCALC 88 12/14/2017   ALT 32 12/14/2017   AST 19 12/14/2017   NA 138 12/14/2017   K 4.5 12/14/2017   CL 101 12/14/2017   CREATININE 0.95 12/14/2017   BUN 12 12/14/2017   CO2 30 12/14/2017   TSH 2.83 01/13/2017   HGBA1C 5.7 12/15/2017    Dg Chest 2 View  Result Date: 02/09/2017 CLINICAL DATA:  Chest pain EXAM: CHEST  2 VIEW COMPARISON:  None. FINDINGS: Lungs are clear. Heart size and pulmonary vascularity are normal. No adenopathy. No pneumomediastinum or pneumothorax. No bone lesions. IMPRESSION: No abnormality noted. Electronically Signed   By: Bretta Bang III M.D.   On: 02/09/2017 21:23       Assessment & Plan:   Problem List Items Addressed This Visit    BMI  40.0-44.9, adult (HCC)    Discussed diet and exercise.        Hypercholesterolemia    On lipitor.  Low cholesterol diet and exercise.  Follow lipid panel and liver function tests.        Relevant Orders   Hepatic function panel   Lipid panel   Hypertension, essential    Blood  pressure as outlined.  Continue same medication regimen.  Follow pressures.  Follow metabolic panel.       Relevant Orders   Basic metabolic panel   Leukocytosis    White count normal on recent check.  Follow.       Snoring    Snores.  Wakes up at night. Wakes up "groggy".  Tries split night sleep study.  Could not sleep.  Is interested in home sleep study.  See if can arrange.         Other Visit Diagnoses    Hyperglycemia    -  Primary   Relevant Orders   Hemoglobin A1c       Dale DurhamSCOTT, Gertude Benito, MD

## 2017-12-21 ENCOUNTER — Other Ambulatory Visit: Payer: Self-pay | Admitting: Internal Medicine

## 2017-12-21 DIAGNOSIS — R0683 Snoring: Secondary | ICD-10-CM

## 2017-12-21 DIAGNOSIS — G479 Sleep disorder, unspecified: Secondary | ICD-10-CM

## 2017-12-21 NOTE — Progress Notes (Signed)
Order placed for home sleep study

## 2017-12-29 ENCOUNTER — Encounter: Payer: Self-pay | Admitting: Internal Medicine

## 2017-12-29 LAB — PULMONARY FUNCTION TEST

## 2018-01-09 ENCOUNTER — Encounter: Payer: Self-pay | Admitting: Internal Medicine

## 2018-01-10 ENCOUNTER — Encounter: Payer: Self-pay | Admitting: Internal Medicine

## 2018-01-13 ENCOUNTER — Encounter: Payer: Self-pay | Admitting: Internal Medicine

## 2018-01-28 ENCOUNTER — Other Ambulatory Visit: Payer: Self-pay | Admitting: Internal Medicine

## 2018-02-05 ENCOUNTER — Encounter: Payer: Self-pay | Admitting: Internal Medicine

## 2018-02-08 ENCOUNTER — Other Ambulatory Visit: Payer: Self-pay

## 2018-02-08 MED ORDER — ATORVASTATIN CALCIUM 10 MG PO TABS: 10.0000 mg | ORAL_TABLET | Freq: Every day | ORAL | 6 refills | Status: DC

## 2018-03-01 ENCOUNTER — Other Ambulatory Visit: Payer: Self-pay | Admitting: Internal Medicine

## 2018-04-01 ENCOUNTER — Other Ambulatory Visit: Payer: Self-pay | Admitting: Internal Medicine

## 2018-04-16 ENCOUNTER — Other Ambulatory Visit (INDEPENDENT_AMBULATORY_CARE_PROVIDER_SITE_OTHER): Payer: BC Managed Care – PPO

## 2018-04-16 DIAGNOSIS — I10 Essential (primary) hypertension: Secondary | ICD-10-CM | POA: Diagnosis not present

## 2018-04-16 DIAGNOSIS — R739 Hyperglycemia, unspecified: Secondary | ICD-10-CM | POA: Diagnosis not present

## 2018-04-16 DIAGNOSIS — E7800 Pure hypercholesterolemia, unspecified: Secondary | ICD-10-CM

## 2018-04-16 LAB — HEPATIC FUNCTION PANEL
ALBUMIN: 4.6 g/dL (ref 3.5–5.2)
ALK PHOS: 83 U/L (ref 39–117)
ALT: 31 U/L (ref 0–53)
AST: 19 U/L (ref 0–37)
Bilirubin, Direct: 0.2 mg/dL (ref 0.0–0.3)
TOTAL PROTEIN: 7.6 g/dL (ref 6.0–8.3)
Total Bilirubin: 0.8 mg/dL (ref 0.2–1.2)

## 2018-04-16 LAB — BASIC METABOLIC PANEL
BUN: 10 mg/dL (ref 6–23)
CALCIUM: 9.8 mg/dL (ref 8.4–10.5)
CO2: 32 mEq/L (ref 19–32)
Chloride: 101 mEq/L (ref 96–112)
Creatinine, Ser: 0.88 mg/dL (ref 0.40–1.50)
GFR: 103.62 mL/min (ref >60.00)
GLUCOSE: 95 mg/dL (ref 70–99)
Potassium: 4.5 mEq/L (ref 3.5–5.1)
SODIUM: 137 meq/L (ref 135–145)

## 2018-04-16 LAB — LIPID PANEL
Cholesterol: 165 mg/dL (ref 0–200)
HDL: 42.4 mg/dL (ref >39.00)
LDL Cholesterol: 98 mg/dL (ref 0–99)
NONHDL: 122.24
Total CHOL/HDL Ratio: 4
Triglycerides: 121 mg/dL (ref 0.0–149.0)
VLDL: 24.2 mg/dL (ref 0.0–40.0)

## 2018-04-16 LAB — HEMOGLOBIN A1C: HEMOGLOBIN A1C: 5.7 % (ref 4.6–6.5)

## 2018-04-19 ENCOUNTER — Encounter: Payer: Self-pay | Admitting: Internal Medicine

## 2018-04-23 ENCOUNTER — Ambulatory Visit: Payer: BC Managed Care – PPO | Admitting: Internal Medicine

## 2018-04-23 DIAGNOSIS — G4730 Sleep apnea, unspecified: Secondary | ICD-10-CM | POA: Diagnosis not present

## 2018-04-23 DIAGNOSIS — Z23 Encounter for immunization: Secondary | ICD-10-CM

## 2018-04-23 DIAGNOSIS — I10 Essential (primary) hypertension: Secondary | ICD-10-CM | POA: Diagnosis not present

## 2018-04-23 DIAGNOSIS — R739 Hyperglycemia, unspecified: Secondary | ICD-10-CM

## 2018-04-23 DIAGNOSIS — E7800 Pure hypercholesterolemia, unspecified: Secondary | ICD-10-CM | POA: Diagnosis not present

## 2018-04-23 DIAGNOSIS — Z6839 Body mass index (BMI) 39.0-39.9, adult: Secondary | ICD-10-CM | POA: Diagnosis not present

## 2018-04-23 NOTE — Progress Notes (Signed)
Patient ID: PETR BONTEMPO, male   DOB: 1980/10/19, 37 y.o.   MRN: [id number]   Subjective:    Patient ID: Glennon Mac, male    DOB: 04/17/1981, 37 y.o.   MRN: [id number]  HPI  Patient here for a scheduled follow up.  He reports he is doing well.  Feels good.  Discussed diet and exercise.  Has lost weight.  Is exercising regularly.  No chest pain.  No sob.  No acid reflux.  No abdominal pain.  Bowels moving.  No urine change.  Request to have testosterone level checked.     Past Medical History:  Diagnosis Date  . Chest discomfort   . Dyspnea on exertion   . GERD (gastroesophageal reflux disease)   . Hyperlipidemia   . Morbid obesity (HCC)   . Overactive bladder    History reviewed. No pertinent surgical history. Family History  Problem Relation Age of Onset  . Prostate cancer Paternal Grandfather        MI @ 64.  Marland Kitchen Heart disease Paternal Grandfather   . Prostate cancer Paternal Uncle   . Lung cancer Maternal Grandmother   . Hypertension Mother        currently in her 56's  . Hypercholesterolemia Mother   . Diabetes Mother   . Hypertension Father        currently in his 73's  . Hypercholesterolemia Father   . Heart disease Maternal Grandfather        MI in his 59's  . Colon cancer Neg Hx    Social History   Socioeconomic History  . Marital status: Single    Spouse name: Not on file  . Number of children: Not on file  . Years of education: Not on file  . Highest education level: Not on file  Occupational History  . Occupation: Building control surveyor: [employer]  Social Needs  . Financial resource strain: Not on file  . Food insecurity:    Worry: Not on file    Inability: Not on file  . Transportation needs:    Medical: Not on file    Non-medical: Not on file  Tobacco Use  . Smoking status: Never Smoker  . Smokeless tobacco: Current User    Types: Chew  Substance and Sexual Activity  . Alcohol use: Yes    Alcohol/week: 0.0  standard drinks    Comment: three to four beers a month.  . Drug use: No  . Sexual activity: Not on file  Lifestyle  . Physical activity:    Days per week: Not on file    Minutes per session: Not on file  . Stress: Not on file  Relationships  . Social connections:    Talks on phone: Not on file    Gets together: Not on file    Attends religious service: Not on file    Active member of club or organization: Not on file    Attends meetings of clubs or organizations: Not on file    Relationship status: Not on file  Other Topics Concern  . Not on file  Social History Narrative   Lives in [place] with family.  Single.  No children.  Does routinely exercise - wt training, treadmill.    Outpatient Encounter Medications as of 04/23/2018  Medication Sig  . atorvastatin (LIPITOR) 10 MG tablet Take 1 tablet (10 mg total) by mouth daily.  . cetirizine (ZYRTEC) 10 MG tablet Take 10 mg  daily by mouth.  Marland Kitchen lisinopril (PRINIVIL,ZESTRIL) 10 MG tablet TAKE 1 TABLET(10 MG) BY MOUTH DAILY  . Multiple Vitamin (MULTIVITAMIN) tablet Take 1 tablet by mouth daily.   No facility-administered encounter medications on file as of 04/23/2018.     Review of Systems  Constitutional: Negative for appetite change and unexpected weight change.  HENT: Negative for congestion and sinus pressure.   Respiratory: Negative for cough, chest tightness and shortness of breath.   Cardiovascular: Negative for chest pain, palpitations and leg swelling.  Gastrointestinal: Negative for abdominal pain, diarrhea, nausea and vomiting.  Genitourinary: Negative for difficulty urinating and dysuria.  Musculoskeletal: Negative for joint swelling and myalgias.  Skin: Negative for color change and rash.  Neurological: Negative for dizziness, light-headedness and headaches.  Psychiatric/Behavioral: Negative for agitation and dysphoric mood.       Objective:     Blood pressure rechecked by me:  116/72  Physical Exam    Constitutional: He appears well-developed and well-nourished. No distress.  HENT:  Nose: Nose normal.  Mouth/Throat: Oropharynx is clear and moist.  Neck: Neck supple.  Cardiovascular: Normal rate and regular rhythm.  Pulmonary/Chest: Effort normal and breath sounds normal. No respiratory distress.  Abdominal: Soft. Bowel sounds are normal. There is no tenderness.  Musculoskeletal: He exhibits no edema or tenderness.  Lymphadenopathy:    He has no cervical adenopathy.  Skin: No rash noted. No erythema.  Psychiatric: He has a normal mood and affect. His behavior is normal.    BP 136/82 (BP Location: Left Arm, Patient Position: Sitting, Cuff Size: Large)   Pulse 97   Temp 98.3 F (36.8 C) (Oral)   Resp 18   Wt 283 lb (128.4 kg)   SpO2 98%   BMI 39.47 kg/m  Wt Readings from Last 3 Encounters:  04/23/18 283 lb (128.4 kg)  12/16/17 290 lb (131.5 kg)  09/15/17 288 lb 3.2 oz (130.7 kg)     Lab Results  Component Value Date   WBC 9.7 12/14/2017   HGB 14.4 12/14/2017   HCT 41.6 12/14/2017   PLT 379.0 12/14/2017   GLUCOSE 95 04/16/2018   CHOL 165 04/16/2018   TRIG 121.0 04/16/2018   HDL 42.40 04/16/2018   LDLDIRECT 159.0 11/06/2014   LDLCALC 98 04/16/2018   ALT 31 04/16/2018   AST 19 04/16/2018   NA 137 04/16/2018   K 4.5 04/16/2018   CL 101 04/16/2018   CREATININE 0.88 04/16/2018   BUN 10 04/16/2018   CO2 32 04/16/2018   TSH 2.83 01/13/2017   HGBA1C 5.7 04/16/2018    Dg Chest 2 View  Result Date: 02/09/2017 CLINICAL DATA:  Chest pain EXAM: CHEST  2 VIEW COMPARISON:  None. FINDINGS: Lungs are clear. Heart size and pulmonary vascularity are normal. No adenopathy. No pneumomediastinum or pneumothorax. No bone lesions. IMPRESSION: No abnormality noted. Electronically Signed   By: Bretta Bang III M.D.   On: 02/09/2017 21:23       Assessment & Plan:   Problem List Items Addressed This Visit    BMI 39.0-39.9,adult    Is exercising.  Has adjusted diet.  Has  lost weight.  Discussed diet and exercise.  Follow.        Hypercholesterolemia    On lipitor.  Low cholesterol diet and exercise.  Follow lipid panel and liver function tests.        Hypertension, essential    Blood pressure under good control.  Continue same medication regimen.  Follow pressures.  Follow metabolic panel.  Relevant Orders   Testosterone   Sleep apnea    Had sleep study.  Wearing cpap now.  Doing well with this.  Follow.         Other Visit Diagnoses    Need for influenza vaccination       Relevant Orders   Flu Vaccine QUAD 6+ mos PF IM (Fluarix Quad PF) (Completed)   Hyperglycemia           Dale Durhamharlene Brok Stocking, MD

## 2018-04-26 ENCOUNTER — Encounter: Payer: Self-pay | Admitting: Internal Medicine

## 2018-04-26 NOTE — Assessment & Plan Note (Signed)
On lipitor.  Low cholesterol diet and exercise.  Follow lipid panel and liver function tests.   

## 2018-04-26 NOTE — Assessment & Plan Note (Signed)
Blood pressure under good control.  Continue same medication regimen.  Follow pressures.  Follow metabolic panel.   

## 2018-04-26 NOTE — Assessment & Plan Note (Signed)
Is exercising.  Has adjusted diet.  Has lost weight.  Discussed diet and exercise.  Follow.

## 2018-04-26 NOTE — Assessment & Plan Note (Signed)
Had sleep study.  Wearing cpap now.  Doing well with this.  Follow.

## 2018-05-07 ENCOUNTER — Other Ambulatory Visit (INDEPENDENT_AMBULATORY_CARE_PROVIDER_SITE_OTHER): Payer: BC Managed Care – PPO

## 2018-05-07 DIAGNOSIS — I10 Essential (primary) hypertension: Secondary | ICD-10-CM

## 2018-05-07 NOTE — Addendum Note (Signed)
Addended by: Warden FillersWRIGHT, Tekla Malachowski S on: 05/07/2018 03:26 PM   Modules accepted: Orders

## 2018-05-13 LAB — TESTOS,TOTAL,FREE AND SHBG (FEMALE)
FREE TESTOSTERONE: 49 pg/mL (ref 35.0–155.0)
Sex Hormone Binding: 37 nmol/L (ref 10–50)
TESTOSTERONE, TOTAL, LC-MS-MS: 377 ng/dL (ref 250–1100)

## 2018-05-14 ENCOUNTER — Encounter: Payer: Self-pay | Admitting: Internal Medicine

## 2018-07-02 ENCOUNTER — Other Ambulatory Visit: Payer: Self-pay | Admitting: Internal Medicine

## 2018-09-09 ENCOUNTER — Other Ambulatory Visit: Payer: Self-pay | Admitting: Internal Medicine

## 2018-09-27 ENCOUNTER — Other Ambulatory Visit: Payer: Self-pay | Admitting: Internal Medicine

## 2018-10-20 ENCOUNTER — Telehealth: Payer: Self-pay | Admitting: *Deleted

## 2018-10-20 DIAGNOSIS — E7800 Pure hypercholesterolemia, unspecified: Secondary | ICD-10-CM

## 2018-10-20 DIAGNOSIS — R739 Hyperglycemia, unspecified: Secondary | ICD-10-CM

## 2018-10-20 DIAGNOSIS — I10 Essential (primary) hypertension: Secondary | ICD-10-CM

## 2018-10-20 NOTE — Telephone Encounter (Signed)
Please place future lab orders for lab appt on Friday 2/28.  Thanks

## 2018-10-20 NOTE — Telephone Encounter (Signed)
Order placed for future labs

## 2018-10-22 ENCOUNTER — Other Ambulatory Visit (INDEPENDENT_AMBULATORY_CARE_PROVIDER_SITE_OTHER): Payer: BC Managed Care – PPO

## 2018-10-22 ENCOUNTER — Encounter: Payer: Self-pay | Admitting: Internal Medicine

## 2018-10-22 DIAGNOSIS — I10 Essential (primary) hypertension: Secondary | ICD-10-CM

## 2018-10-22 DIAGNOSIS — E7800 Pure hypercholesterolemia, unspecified: Secondary | ICD-10-CM

## 2018-10-22 DIAGNOSIS — R739 Hyperglycemia, unspecified: Secondary | ICD-10-CM

## 2018-10-22 LAB — LIPID PANEL
Cholesterol: 161 mg/dL (ref 0–200)
HDL: 48.3 mg/dL (ref >39.00)
LDL Cholesterol: 90 mg/dL (ref 0–99)
NonHDL: 112.36
Total CHOL/HDL Ratio: 3
Triglycerides: 114 mg/dL (ref 0.0–149.0)
VLDL: 22.8 mg/dL (ref 0.0–40.0)

## 2018-10-22 LAB — HEPATIC FUNCTION PANEL
ALT: 35 U/L (ref 0–53)
AST: 21 U/L (ref 0–37)
Albumin: 4.6 g/dL (ref 3.5–5.2)
Alkaline Phosphatase: 78 U/L (ref 39–117)
BILIRUBIN DIRECT: 0.1 mg/dL (ref 0.0–0.3)
TOTAL PROTEIN: 7.4 g/dL (ref 6.0–8.3)
Total Bilirubin: 0.8 mg/dL (ref 0.2–1.2)

## 2018-10-22 LAB — BASIC METABOLIC PANEL
BUN: 11 mg/dL (ref 6–23)
CO2: 30 mEq/L (ref 19–32)
Calcium: 9.2 mg/dL (ref 8.4–10.5)
Chloride: 100 mEq/L (ref 96–112)
Creatinine, Ser: 0.86 mg/dL (ref 0.40–1.50)
GFR: 99.83 mL/min (ref >60.00)
Glucose, Bld: 91 mg/dL (ref 70–99)
Potassium: 4.1 mEq/L (ref 3.5–5.1)
Sodium: 138 mEq/L (ref 135–145)

## 2018-10-22 LAB — HEMOGLOBIN A1C: Hgb A1c MFr Bld: 5.6 % (ref 4.6–6.5)

## 2018-10-29 ENCOUNTER — Ambulatory Visit (INDEPENDENT_AMBULATORY_CARE_PROVIDER_SITE_OTHER): Payer: BC Managed Care – PPO | Admitting: Internal Medicine

## 2018-10-29 ENCOUNTER — Encounter: Payer: Self-pay | Admitting: Internal Medicine

## 2018-10-29 VITALS — BP 136/78 | HR 105 | Temp 98.1°F | Resp 16 | Ht 71.0 in | Wt 290.2 lb

## 2018-10-29 DIAGNOSIS — E7800 Pure hypercholesterolemia, unspecified: Secondary | ICD-10-CM

## 2018-10-29 DIAGNOSIS — R109 Unspecified abdominal pain: Secondary | ICD-10-CM

## 2018-10-29 DIAGNOSIS — R140 Abdominal distension (gaseous): Secondary | ICD-10-CM

## 2018-10-29 DIAGNOSIS — I10 Essential (primary) hypertension: Secondary | ICD-10-CM

## 2018-10-29 DIAGNOSIS — K219 Gastro-esophageal reflux disease without esophagitis: Secondary | ICD-10-CM

## 2018-10-29 DIAGNOSIS — Z0000 Encounter for general adult medical examination without abnormal findings: Secondary | ICD-10-CM | POA: Diagnosis not present

## 2018-10-29 DIAGNOSIS — G4730 Sleep apnea, unspecified: Secondary | ICD-10-CM

## 2018-10-29 DIAGNOSIS — D72829 Elevated white blood cell count, unspecified: Secondary | ICD-10-CM

## 2018-10-29 DIAGNOSIS — R195 Other fecal abnormalities: Secondary | ICD-10-CM

## 2018-10-29 LAB — HEPATIC FUNCTION PANEL
ALT: 37 U/L (ref 0–53)
AST: 20 U/L (ref 0–37)
Albumin: 4.8 g/dL (ref 3.5–5.2)
Alkaline Phosphatase: 88 U/L (ref 39–117)
BILIRUBIN TOTAL: 0.7 mg/dL (ref 0.2–1.2)
Bilirubin, Direct: 0.1 mg/dL (ref 0.0–0.3)
Total Protein: 7.7 g/dL (ref 6.0–8.3)

## 2018-10-29 LAB — CBC WITH DIFFERENTIAL/PLATELET
Basophils Absolute: 0.1 10*3/uL (ref 0.0–0.1)
Basophils Relative: 0.7 % (ref 0.0–3.0)
Eosinophils Absolute: 0.3 10*3/uL (ref 0.0–0.7)
Eosinophils Relative: 3.2 % (ref 0.0–5.0)
HCT: 44.5 % (ref 39.0–52.0)
HEMOGLOBIN: 15.2 g/dL (ref 13.0–17.0)
Lymphocytes Relative: 21.1 % (ref 12.0–46.0)
Lymphs Abs: 2.3 10*3/uL (ref 0.7–4.0)
MCHC: 34.2 g/dL (ref 30.0–36.0)
MCV: 86.7 fl (ref 78.0–100.0)
Monocytes Absolute: 1 10*3/uL (ref 0.1–1.0)
Monocytes Relative: 9.7 % (ref 3.0–12.0)
Neutro Abs: 7 10*3/uL (ref 1.4–7.7)
Neutrophils Relative %: 65.3 % (ref 43.0–77.0)
Platelets: 404 10*3/uL — ABNORMAL HIGH (ref 150.0–400.0)
RBC: 5.13 Mil/uL (ref 4.22–5.81)
RDW: 13.4 % (ref 11.5–15.5)
WBC: 10.8 10*3/uL — AB (ref 4.0–10.5)

## 2018-10-29 LAB — BASIC METABOLIC PANEL
BUN: 12 mg/dL (ref 6–23)
CHLORIDE: 98 meq/L (ref 96–112)
CO2: 31 mEq/L (ref 19–32)
CREATININE: 0.96 mg/dL (ref 0.40–1.50)
Calcium: 9.8 mg/dL (ref 8.4–10.5)
GFR: 87.92 mL/min (ref >60.00)
Glucose, Bld: 103 mg/dL — ABNORMAL HIGH (ref 70–99)
Potassium: 4.1 mEq/L (ref 3.5–5.1)
Sodium: 136 mEq/L (ref 135–145)

## 2018-10-29 LAB — AMYLASE: Amylase: 27 U/L (ref 27–131)

## 2018-10-29 LAB — LIPASE: Lipase: 31 U/L (ref 11.0–59.0)

## 2018-10-29 NOTE — Patient Instructions (Signed)
pepcid 20mg  - one per day

## 2018-10-29 NOTE — Progress Notes (Signed)
Patient ID: Gary Fleming, male   DOB: 1980-12-09, 38 y.o.   MRN: [id number]   Subjective:    Patient ID: Gary Fleming, male    DOB: [birth year], 38 y.o.   MRN: [id number]  HPI  Patient here for his physical exam.  He reports he has been having increased abdominal bloating.  Reports that starting 2.5 weeks ago, started noticing some increased abdominal discomfort.  Had been taking a probiotic.  Stopped.  Wanted to see if was causing symptoms.  States notices pain after eating.  Will have to go to the bathroom within 30 minutes.  No vomiting.  No actual acid reflux, but reports some burning in his stomach and throughout his abdomen.  Increased bloating after lunch.  No blood in his stool.  Has been taking pepto bismol and GasX.  Stool loose.  No fever.  No urine change.  No chest pain or sob.  Mother has been diagnosed with c.diff.  Have shared bathroom.     Past Medical History:  Diagnosis Date  . Chest discomfort   . Dyspnea on exertion   . GERD (gastroesophageal reflux disease)   . Hyperlipidemia   . Morbid obesity (HCC)   . Overactive bladder    History reviewed. No pertinent surgical history. Family History  Problem Relation Age of Onset  . Prostate cancer Paternal Grandfather        MI @ 51.  Marland Kitchen Heart disease Paternal Grandfather   . Prostate cancer Paternal Uncle   . Lung cancer Maternal Grandmother   . Hypertension Mother        currently in her 28's  . Hypercholesterolemia Mother   . Diabetes Mother   . Hypertension Father        currently in his 63's  . Hypercholesterolemia Father   . Heart disease Maternal Grandfather        MI in his 78's  . Colon cancer Neg Hx    Social History   Socioeconomic History  . Marital status: Single    Spouse name: Not on file  . Number of children: Not on file  . Years of education: Not on file  . Highest education level: Not on file  Occupational History  . Occupation: Building control surveyor: [employer]    Social Needs  . Financial resource strain: Not on file  . Food insecurity:    Worry: Not on file    Inability: Not on file  . Transportation needs:    Medical: Not on file    Non-medical: Not on file  Tobacco Use  . Smoking status: Never Smoker  . Smokeless tobacco: Current User    Types: Chew  Substance and Sexual Activity  . Alcohol use: Yes    Alcohol/week: 0.0 standard drinks    Comment: three to four beers a month.  . Drug use: No  . Sexual activity: Not on file  Lifestyle  . Physical activity:    Days per week: Not on file    Minutes per session: Not on file  . Stress: Not on file  Relationships  . Social connections:    Talks on phone: Not on file    Gets together: Not on file    Attends religious service: Not on file    Active member of club or organization: Not on file    Attends meetings of clubs or organizations: Not on file    Relationship status: Not on file  Other Topics  Concern  . Not on file  Social History Narrative   Lives in [place] with family.  Single.  No children.  Does routinely exercise - wt training, treadmill.    Outpatient Encounter Medications as of 10/29/2018  Medication Sig  . atorvastatin (LIPITOR) 10 MG tablet TAKE 1 TABLET(10 MG) BY MOUTH DAILY  . cetirizine (ZYRTEC) 10 MG tablet Take 10 mg daily by mouth.  Marland Kitchen lisinopril (PRINIVIL,ZESTRIL) 10 MG tablet TAKE 1 TABLET(10 MG) BY MOUTH DAILY  . Multiple Vitamin (MULTIVITAMIN) tablet Take 1 tablet by mouth daily.   No facility-administered encounter medications on file as of 10/29/2018.     Review of Systems  Constitutional: Negative for appetite change, fever and unexpected weight change.  HENT: Negative for congestion and sinus pressure.   Eyes: Negative for pain and visual disturbance.  Respiratory: Negative for cough, chest tightness and shortness of breath.   Cardiovascular: Negative for chest pain, palpitations and leg swelling.  Gastrointestinal: Negative for constipation, nausea  and vomiting.       Abdominal discomfort and abdominal bloating as outlined.  Loose stool.  No blood.   Genitourinary: Negative for difficulty urinating and dysuria.  Musculoskeletal: Negative for joint swelling and myalgias.  Skin: Negative for color change and rash.  Neurological: Negative for dizziness, light-headedness and headaches.  Hematological: Negative for adenopathy. Does not bruise/bleed easily.  Psychiatric/Behavioral: Negative for agitation and dysphoric mood.       Objective:    Physical Exam Constitutional:      General: He is not in acute distress.    Appearance: Normal appearance. He is well-developed.  HENT:     Head: Normocephalic and atraumatic.     Nose: Nose normal. No congestion.     Mouth/Throat:     Pharynx: No oropharyngeal exudate or posterior oropharyngeal erythema.  Eyes:     General:        Right eye: No discharge.        Left eye: No discharge.     Conjunctiva/sclera: Conjunctivae normal.  Neck:     Musculoskeletal: Neck supple. No muscular tenderness.     Thyroid: No thyromegaly.  Cardiovascular:     Rate and Rhythm: Normal rate and regular rhythm.  Pulmonary:     Effort: No respiratory distress.     Breath sounds: Normal breath sounds. No wheezing.  Abdominal:     General: Bowel sounds are normal.     Palpations: Abdomen is soft.     Tenderness: There is no abdominal tenderness.  Genitourinary:    Comments: Rectal exam:  Heme negative.  Musculoskeletal:        General: No tenderness.  Lymphadenopathy:     Cervical: No cervical adenopathy.  Skin:    Findings: No erythema or rash.  Neurological:     Mental Status: He is alert and oriented to person, place, and time.  Psychiatric:        Mood and Affect: Mood normal.        Behavior: Behavior normal.     BP 136/78   Pulse (!) 105   Temp 98.1 F (36.7 C) (Oral)   Resp 16   Ht 5\' 11"  (1.803 m)   Wt 290 lb 3.2 oz (131.6 kg)   SpO2 98%   BMI 40.47 kg/m  Wt Readings from Last  3 Encounters:  10/29/18 290 lb 3.2 oz (131.6 kg)  04/23/18 283 lb (128.4 kg)  12/16/17 290 lb (131.5 kg)     Lab Results  Component  Value Date   WBC 10.8 (H) 10/29/2018   HGB 15.2 10/29/2018   HCT 44.5 10/29/2018   PLT 404.0 (H) 10/29/2018   GLUCOSE 103 (H) 10/29/2018   CHOL 161 10/22/2018   TRIG 114.0 10/22/2018   HDL 48.30 10/22/2018   LDLDIRECT 159.0 11/06/2014   LDLCALC 90 10/22/2018   ALT 37 10/29/2018   AST 20 10/29/2018   NA 136 10/29/2018   K 4.1 10/29/2018   CL 98 10/29/2018   CREATININE 0.96 10/29/2018   BUN 12 10/29/2018   CO2 31 10/29/2018   TSH 2.83 01/13/2017   HGBA1C 5.6 10/22/2018    Dg Chest 2 View  Result Date: 02/09/2017 CLINICAL DATA:  Chest pain EXAM: CHEST  2 VIEW COMPARISON:  None. FINDINGS: Lungs are clear. Heart size and pulmonary vascularity are normal. No adenopathy. No pneumomediastinum or pneumothorax. No bone lesions. IMPRESSION: No abnormality noted. Electronically Signed   By: Bretta Bang III M.D.   On: 02/09/2017 21:23       Assessment & Plan:   Problem List Items Addressed This Visit    Abdominal pain    Abdominal pain and bloating as outlined.  Loose stool.  Start pepcid as outlined.  Check cbc, liver panel and pancreas tests.  Also check stool for c.diff given mother recently diagnosed and sharing bathroom.  Check abdominal ultrasound.        Relevant Orders   CBC with Differential/Platelet (Completed)   Hepatic function panel (Completed)   Amylase (Completed)   Lipase (Completed)   Basic metabolic panel (Completed)   US Abdomen Complete   GERD (gastroesophageal reflux disease)    Some burning in his stomach and throughout abdomen.  No increased reflux reported.  Start pepcid 20mg  q day.        Health care maintenance    Physical today 10/29/18.  Followed by urology for prostate checks.  Follow cholesterol.        Hypercholesterolemia    On lipitor.  Low cholesterol diet and exercise.  Follow lipid panel and liver  function tests.        Hypertension, essential    Blood pressure under good control.  Continue same medication regimen.  Follow pressures.  Follow metabolic panel.        Leukocytosis    Follow cbc.  Last check wnl.        Sleep apnea    Continue cpap.         Other Visit Diagnoses    Routine general medical examination at a health care facility    -  Primary   Bloating       Relevant Orders   Gastrointestinal Pathogen Panel PCR   US Abdomen Complete   Loose stools       Relevant Orders   Gastrointestinal Pathogen Panel PCR       Dale Sterlington, MD

## 2018-10-31 ENCOUNTER — Encounter: Payer: Self-pay | Admitting: Internal Medicine

## 2018-10-31 DIAGNOSIS — R109 Unspecified abdominal pain: Secondary | ICD-10-CM | POA: Insufficient documentation

## 2018-10-31 NOTE — Assessment & Plan Note (Signed)
Some burning in his stomach and throughout abdomen.  No increased reflux reported.  Start pepcid 20mg  q day.

## 2018-10-31 NOTE — Assessment & Plan Note (Signed)
Blood pressure under good control.  Continue same medication regimen.  Follow pressures.  Follow metabolic panel.   

## 2018-10-31 NOTE — Assessment & Plan Note (Signed)
Abdominal pain and bloating as outlined.  Loose stool.  Start pepcid as outlined.  Check cbc, liver panel and pancreas tests.  Also check stool for c.diff given mother recently diagnosed and sharing bathroom.  Check abdominal ultrasound.

## 2018-10-31 NOTE — Assessment & Plan Note (Signed)
Follow cbc.  Last check wnl.  

## 2018-10-31 NOTE — Assessment & Plan Note (Signed)
Continue cpap.  

## 2018-10-31 NOTE — Assessment & Plan Note (Signed)
On lipitor.  Low cholesterol diet and exercise.  Follow lipid panel and liver function tests.   

## 2018-10-31 NOTE — Assessment & Plan Note (Signed)
Physical today 10/29/18.  Followed by urology for prostate checks.  Follow cholesterol.

## 2018-11-01 ENCOUNTER — Encounter: Payer: Self-pay | Admitting: Internal Medicine

## 2018-11-05 ENCOUNTER — Other Ambulatory Visit: Payer: BC Managed Care – PPO

## 2018-11-05 ENCOUNTER — Other Ambulatory Visit: Payer: Self-pay

## 2018-11-05 DIAGNOSIS — R140 Abdominal distension (gaseous): Secondary | ICD-10-CM

## 2018-11-05 DIAGNOSIS — R195 Other fecal abnormalities: Secondary | ICD-10-CM

## 2018-11-08 ENCOUNTER — Encounter: Payer: Self-pay | Admitting: Internal Medicine

## 2018-11-08 DIAGNOSIS — R140 Abdominal distension (gaseous): Secondary | ICD-10-CM

## 2018-11-08 LAB — GASTROINTESTINAL PATHOGEN PANEL PCR
C. difficile Tox A/B, PCR: NOT DETECTED
Campylobacter, PCR: NOT DETECTED
Cryptosporidium, PCR: NOT DETECTED
E coli (ETEC) LT/ST PCR: NOT DETECTED
E coli (STEC) stx1/stx2, PCR: NOT DETECTED
E coli 0157, PCR: NOT DETECTED
Giardia lamblia, PCR: NOT DETECTED
Norovirus, PCR: NOT DETECTED
Rotavirus A, PCR: NOT DETECTED
Salmonella, PCR: NOT DETECTED
Shigella, PCR: NOT DETECTED

## 2018-11-09 ENCOUNTER — Telehealth: Payer: Self-pay

## 2018-11-09 NOTE — Telephone Encounter (Signed)
Copied from CRM [id number]. 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

## 2018-11-12 NOTE — Telephone Encounter (Signed)
Order placed for GI referral.   

## 2018-11-30 ENCOUNTER — Encounter: Payer: Self-pay | Admitting: Internal Medicine

## 2018-12-09 ENCOUNTER — Encounter: Payer: Self-pay | Admitting: Internal Medicine

## 2018-12-10 ENCOUNTER — Ambulatory Visit: Payer: BC Managed Care – PPO | Admitting: Internal Medicine

## 2018-12-10 ENCOUNTER — Other Ambulatory Visit: Payer: Self-pay

## 2018-12-10 MED ORDER — ATORVASTATIN CALCIUM 10 MG PO TABS: ORAL_TABLET | ORAL | 1 refills | Status: DC

## 2018-12-31 ENCOUNTER — Encounter: Payer: Self-pay | Admitting: Internal Medicine

## 2019-01-03 ENCOUNTER — Other Ambulatory Visit: Payer: Self-pay

## 2019-01-03 MED ORDER — LISINOPRIL 10 MG PO TABS: ORAL_TABLET | ORAL | 0 refills | Status: DC

## 2019-01-11 ENCOUNTER — Telehealth: Payer: Self-pay

## 2019-01-11 NOTE — Telephone Encounter (Signed)
Copied from CRM [id number]. Topic: General - Other >> Jan 11, 2019 12:10 PM Rica Koyanagi, Barbee Cough wrote: Reason for CRM: pt is returning call to Rashida about ultrasound, please call back Thank you

## 2019-02-04 ENCOUNTER — Ambulatory Visit
Admission: RE | Admit: 2019-02-04 | Discharge: 2019-02-04 | Disposition: A | Discharge status: Home / Self Care | Payer: BC Managed Care – PPO | Source: Ambulatory Visit | Attending: Internal Medicine | Admitting: Internal Medicine

## 2019-02-04 ENCOUNTER — Other Ambulatory Visit: Payer: Self-pay

## 2019-02-04 DIAGNOSIS — R140 Abdominal distension (gaseous): Secondary | ICD-10-CM | POA: Diagnosis present

## 2019-02-04 DIAGNOSIS — R109 Unspecified abdominal pain: Secondary | ICD-10-CM | POA: Diagnosis present

## 2019-02-09 ENCOUNTER — Other Ambulatory Visit: Payer: Self-pay | Admitting: Internal Medicine

## 2019-02-09 DIAGNOSIS — R109 Unspecified abdominal pain: Secondary | ICD-10-CM

## 2019-02-09 DIAGNOSIS — R140 Abdominal distension (gaseous): Secondary | ICD-10-CM

## 2019-02-09 NOTE — Progress Notes (Signed)
Order placed for GI referral.   

## 2019-03-29 ENCOUNTER — Other Ambulatory Visit: Payer: Self-pay

## 2019-03-29 MED ORDER — LISINOPRIL 10 MG PO TABS: ORAL_TABLET | ORAL | 0 refills | Status: DC

## 2019-05-21 ENCOUNTER — Encounter: Payer: Self-pay | Admitting: Internal Medicine

## 2019-06-11 ENCOUNTER — Encounter: Payer: Self-pay | Admitting: Internal Medicine

## 2019-06-13 ENCOUNTER — Other Ambulatory Visit: Payer: Self-pay

## 2019-06-13 MED ORDER — ATORVASTATIN CALCIUM 10 MG PO TABS: ORAL_TABLET | ORAL | 1 refills | Status: DC

## 2019-07-01 ENCOUNTER — Other Ambulatory Visit: Payer: Self-pay

## 2019-07-01 ENCOUNTER — Encounter: Payer: Self-pay | Admitting: Internal Medicine

## 2019-07-01 ENCOUNTER — Ambulatory Visit: Payer: BC Managed Care – PPO | Admitting: Internal Medicine

## 2019-07-01 VITALS — BP 112/70 | HR 75 | Temp 98.6°F | Resp 16 | Wt 287.0 lb

## 2019-07-01 DIAGNOSIS — G4730 Sleep apnea, unspecified: Secondary | ICD-10-CM

## 2019-07-01 DIAGNOSIS — R079 Chest pain, unspecified: Secondary | ICD-10-CM

## 2019-07-01 DIAGNOSIS — R739 Hyperglycemia, unspecified: Secondary | ICD-10-CM | POA: Diagnosis not present

## 2019-07-01 DIAGNOSIS — K219 Gastro-esophageal reflux disease without esophagitis: Secondary | ICD-10-CM

## 2019-07-01 DIAGNOSIS — E7800 Pure hypercholesterolemia, unspecified: Secondary | ICD-10-CM | POA: Diagnosis not present

## 2019-07-01 DIAGNOSIS — I10 Essential (primary) hypertension: Secondary | ICD-10-CM | POA: Diagnosis not present

## 2019-07-01 LAB — CBC WITH DIFFERENTIAL/PLATELET
Basophils Absolute: 0 10*3/uL (ref 0.0–0.1)
Basophils Relative: 0.4 % (ref 0.0–3.0)
Eosinophils Absolute: 0.4 10*3/uL (ref 0.0–0.7)
Eosinophils Relative: 4 % (ref 0.0–5.0)
HCT: 43.8 % (ref 39.0–52.0)
Hemoglobin: 14.7 g/dL (ref 13.0–17.0)
Lymphocytes Relative: 20.3 % (ref 12.0–46.0)
Lymphs Abs: 1.9 10*3/uL (ref 0.7–4.0)
MCHC: 33.6 g/dL (ref 30.0–36.0)
MCV: 88.4 fl (ref 78.0–100.0)
Monocytes Absolute: 0.9 10*3/uL (ref 0.1–1.0)
Monocytes Relative: 9.2 % (ref 3.0–12.0)
Neutro Abs: 6.3 10*3/uL (ref 1.4–7.7)
Neutrophils Relative %: 66.1 % (ref 43.0–77.0)
Platelets: 365 10*3/uL (ref 150.0–400.0)
RBC: 4.96 Mil/uL (ref 4.22–5.81)
RDW: 12.9 % (ref 11.5–15.5)
WBC: 9.5 10*3/uL (ref 4.0–10.5)

## 2019-07-01 LAB — BASIC METABOLIC PANEL
BUN: 13 mg/dL (ref 6–23)
CO2: 28 mEq/L (ref 19–32)
Calcium: 9.2 mg/dL (ref 8.4–10.5)
Chloride: 102 mEq/L (ref 96–112)
Creatinine, Ser: 0.75 mg/dL (ref 0.40–1.50)
GFR: 116.48 mL/min (ref >60.00)
Glucose, Bld: 95 mg/dL (ref 70–99)
Potassium: 4.5 mEq/L (ref 3.5–5.1)
Sodium: 139 mEq/L (ref 135–145)

## 2019-07-01 LAB — HEPATIC FUNCTION PANEL
ALT: 32 U/L (ref 0–53)
AST: 19 U/L (ref 0–37)
Albumin: 4.5 g/dL (ref 3.5–5.2)
Alkaline Phosphatase: 87 U/L (ref 39–117)
Bilirubin, Direct: 0.1 mg/dL (ref 0.0–0.3)
Total Bilirubin: 0.7 mg/dL (ref 0.2–1.2)
Total Protein: 7.1 g/dL (ref 6.0–8.3)

## 2019-07-01 LAB — TSH: TSH: 1.87 u[IU]/mL (ref 0.35–4.50)

## 2019-07-01 LAB — LIPID PANEL
Cholesterol: 161 mg/dL (ref 0–200)
HDL: 45.8 mg/dL (ref >39.00)
LDL Cholesterol: 89 mg/dL (ref 0–99)
NonHDL: 114.7
Total CHOL/HDL Ratio: 4
Triglycerides: 129 mg/dL (ref 0.0–149.0)
VLDL: 25.8 mg/dL (ref 0.0–40.0)

## 2019-07-01 LAB — HEMOGLOBIN A1C: Hgb A1c MFr Bld: 5.7 % (ref 4.6–6.5)

## 2019-07-01 NOTE — Progress Notes (Signed)
Patient ID: Gary Fleming, male   DOB: 1981-03-15, 38 y.o.   MRN: [id number]   Subjective:    Patient ID: Gary Fleming, male    DOB: 1981-03-15, 38 y.o.   MRN: [id number]  HPI  Patient here for a scheduled follow up.  Was recently evaluated for chest pain and increased heart rate.  Saw cardiology.  Has heart monitor.  Planning for stress echo.  Reports no further pain.  Breathing stable.  No abdominal pain or bloating.  No sob.  No cough or congestion.  No fever.  Blood pressure doing well.  Overall feels good.     Past Medical History:  Diagnosis Date  . Chest discomfort   . Dyspnea on exertion   . GERD (gastroesophageal reflux disease)   . Hyperlipidemia   . Morbid obesity (HCC)   . Overactive bladder    History reviewed. No pertinent surgical history. Family History  Problem Relation Age of Onset  . Prostate cancer Paternal Grandfather        MI @ 1240.  Marland Kitchen. Heart disease Paternal Grandfather   . Prostate cancer Paternal Uncle   . Lung cancer Maternal Grandmother   . Hypertension Mother        currently in her 4650's  . Hypercholesterolemia Mother   . Diabetes Mother   . Hypertension Father        currently in his 6350's  . Hypercholesterolemia Father   . Heart disease Maternal Grandfather        MI in his 6770's  . Colon cancer Neg Hx    Social History   Socioeconomic History  . Marital status: Single    Spouse name: Not on file  . Number of children: Not on file  . Years of education: Not on file  . Highest education level: Not on file  Occupational History  . Occupation: Building control surveyorBridge Inspector    Employer: [employer]  Social Needs  . Financial resource strain: Not on file  . Food insecurity    Worry: Not on file    Inability: Not on file  . Transportation needs    Medical: Not on file    Non-medical: Not on file  Tobacco Use  . Smoking status: Never Smoker  . Smokeless tobacco: Current User    Types: Chew  Substance and Sexual Activity  .  Alcohol use: Yes    Alcohol/week: 0.0 standard drinks    Comment: three to four beers a month.  . Drug use: No  . Sexual activity: Not on file  Lifestyle  . Physical activity    Days per week: Not on file    Minutes per session: Not on file  . Stress: Not on file  Relationships  . Social Musicianconnections    Talks on phone: Not on file    Gets together: Not on file    Attends religious service: Not on file    Active member of club or organization: Not on file    Attends meetings of clubs or organizations: Not on file    Relationship status: Not on file  Other Topics Concern  . Not on file  Social History Narrative   [place]ives in [place] with family.  Single.  No children.  Does routinely exercise - wt training, treadmill.    Outpatient Encounter Medications as of 07/01/2019  Medication Sig  . atorvastatin (LIPITOR) 10 MG tablet TAKE 1 TABLET(10 MG) BY MOUTH DAILY  . cetirizine (ZYRTEC) 10 MG tablet  Take 10 mg daily by mouth.  Marland Kitchen lisinopril (ZESTRIL) 10 MG tablet TAKE 1 TABLET(10 MG) BY MOUTH DAILY  . Multiple Vitamin (MULTIVITAMIN) tablet Take 1 tablet by mouth daily.  . pantoprazole (PROTONIX) 20 MG tablet Take 20 mg by mouth daily.   No facility-administered encounter medications on file as of 07/01/2019.    Review of Systems  Constitutional: Negative for appetite change and unexpected weight change.  HENT: Negative for congestion and sinus pressure.   Respiratory: Negative for cough, chest tightness and shortness of breath.   Cardiovascular: Negative for palpitations and leg swelling.       No chest pain now.    Gastrointestinal: Negative for abdominal pain, diarrhea, nausea and vomiting.  Genitourinary: Negative for difficulty urinating and dysuria.  Musculoskeletal: Negative for joint swelling and myalgias.  Skin: Negative for color change and rash.  Neurological: Negative for dizziness, light-headedness and headaches.  Psychiatric/Behavioral: Negative for agitation and  dysphoric mood.       Objective:    Physical Exam Constitutional:      General: He is not in acute distress.    Appearance: Normal appearance. He is well-developed.  HENT:     Head: Normocephalic and atraumatic.     Right Ear: External ear normal.     Left Ear: External ear normal.  Eyes:     General: No scleral icterus.       Right eye: No discharge.        Left eye: No discharge.     Conjunctiva/sclera: Conjunctivae normal.  Neck:     Musculoskeletal: Neck supple. No muscular tenderness.  Cardiovascular:     Rate and Rhythm: Normal rate and regular rhythm.  Pulmonary:     Effort: Pulmonary effort is normal. No respiratory distress.     Breath sounds: Normal breath sounds.  Abdominal:     General: Bowel sounds are normal.     Palpations: Abdomen is soft.     Tenderness: There is no abdominal tenderness.  Musculoskeletal:        General: No swelling or tenderness.  Lymphadenopathy:     Cervical: No cervical adenopathy.  Skin:    Findings: No erythema or rash.  Neurological:     Mental Status: He is alert.  Psychiatric:        Mood and Affect: Mood normal.        Behavior: Behavior normal.     BP 112/70   Pulse 75   Temp 98.6 F (37 C)   Resp 16   Wt 287 lb (130.2 kg)   SpO2 98%   BMI 40.03 kg/m  Wt Readings from Last 3 Encounters:  07/01/19 287 lb (130.2 kg)  10/29/18 290 lb 3.2 oz (131.6 kg)  04/23/18 283 lb (128.4 kg)     Lab Results  Component Value Date   WBC 9.5 07/01/2019   HGB 14.7 07/01/2019   HCT 43.8 07/01/2019   PLT 365.0 07/01/2019   GLUCOSE 95 07/01/2019   CHOL 161 07/01/2019   TRIG 129.0 07/01/2019   HDL 45.80 07/01/2019   LDLDIRECT 159.0 11/06/2014   LDLCALC 89 07/01/2019   ALT 32 07/01/2019   AST 19 07/01/2019   NA 139 07/01/2019   K 4.5 07/01/2019   CL 102 07/01/2019   CREATININE 0.75 07/01/2019   BUN 13 07/01/2019   CO2 28 07/01/2019   TSH 1.87 07/01/2019   HGBA1C 5.7 07/01/2019    US Abdomen Complete  Result  Date: 02/04/2019 CLINICAL DATA:  Abdominal  pain and bloating for several months EXAM: ABDOMEN ULTRASOUND COMPLETE COMPARISON:  None. FINDINGS: Gallbladder: No gallstones or wall thickening visualized. No sonographic Murphy sign noted by sonographer. Common bile duct: Diameter: 1.7 mm. Liver: No focal lesion identified. Within normal limits in parenchymal echogenicity. Portal vein is patent on color Doppler imaging with normal direction of blood flow towards the liver. IVC: No abnormality visualized. Pancreas: Visualized portion unremarkable. Spleen: Size and appearance within normal limits. Right Kidney: Length: 13.3 cm. Echogenicity within normal limits. No mass or hydronephrosis visualized. Left Kidney: Length: 14.4 cm. Echogenicity within normal limits. No mass or hydronephrosis visualized. Abdominal aorta: No aneurysm visualized. Other findings: None. IMPRESSION: No acute abnormality noted. Electronically Signed   By: Inez Catalina M.D.   On: 02/04/2019 12:06       Assessment & Plan:   Problem List Items Addressed This Visit    Chest pain    Saw cardiology.  Has monitor on.  Planning for stress echo.  Scheduled.  No pain now.  Follow.        GERD (gastroesophageal reflux disease) - Primary    Protonix.  Follow.        Relevant Medications   pantoprazole (PROTONIX) 20 MG tablet   Hypercholesterolemia    On lipitor.  Low cholesterol diet and exercise.  Follow lipid panel and liver function tests.        Relevant Orders   Hepatic function panel (Completed)   Lipid panel (Completed)   Hypertension, essential    Blood pressure under good control.  Continue same medication regimen.  Follow pressures.  Follow metabolic panel.        Relevant Orders   CBC with Differential/Platelet (Completed)   TSH (Completed)   Basic metabolic panel (today) (Completed)   Sleep apnea    Continue cpap.        Other Visit Diagnoses    Hyperglycemia       Relevant Orders   Hemoglobin A1c (Completed)        Einar Pheasant, MD

## 2019-07-02 ENCOUNTER — Encounter: Payer: Self-pay | Admitting: Internal Medicine

## 2019-07-03 ENCOUNTER — Encounter: Payer: Self-pay | Admitting: Internal Medicine

## 2019-07-03 DIAGNOSIS — R079 Chest pain, unspecified: Secondary | ICD-10-CM | POA: Insufficient documentation

## 2019-07-03 NOTE — Assessment & Plan Note (Signed)
Protonix.  Follow.  

## 2019-07-03 NOTE — Assessment & Plan Note (Signed)
Saw cardiology.  Has monitor on.  Planning for stress echo.  Scheduled.  No pain now.  Follow.

## 2019-07-03 NOTE — Assessment & Plan Note (Signed)
Blood pressure under good control.  Continue same medication regimen.  Follow pressures.  Follow metabolic panel.   

## 2019-07-03 NOTE — Assessment & Plan Note (Signed)
On lipitor.  Low cholesterol diet and exercise.  Follow lipid panel and liver function tests.   

## 2019-07-03 NOTE — Assessment & Plan Note (Signed)
Continue cpap.  

## 2019-07-04 ENCOUNTER — Other Ambulatory Visit: Payer: Self-pay

## 2019-07-04 MED ORDER — LISINOPRIL 10 MG PO TABS: ORAL_TABLET | ORAL | 2 refills | Status: DC

## 2019-07-11 ENCOUNTER — Encounter: Payer: Self-pay | Admitting: Internal Medicine

## 2019-07-11 ENCOUNTER — Other Ambulatory Visit: Payer: Self-pay

## 2019-07-11 MED ORDER — PANTOPRAZOLE SODIUM 20 MG PO TBEC: 20.0000 mg | DELAYED_RELEASE_TABLET | Freq: Two times a day (BID) | ORAL | 1 refills | Status: DC

## 2019-07-11 NOTE — Telephone Encounter (Signed)
Ok to refill.  Confirm pt doing ok.

## 2019-07-24 ENCOUNTER — Encounter: Payer: Self-pay | Admitting: Internal Medicine

## 2019-07-25 NOTE — Telephone Encounter (Signed)
Yes.  Schedule a virtual visit.  Thanks.

## 2019-07-25 NOTE — Telephone Encounter (Signed)
Pt scheduled a phone visit for tomorrow 07/26/19 @ 4:30 pm.  Ok per Dr. Nicki Reaper.

## 2019-07-26 ENCOUNTER — Encounter: Payer: Self-pay | Admitting: Internal Medicine

## 2019-07-26 ENCOUNTER — Ambulatory Visit (INDEPENDENT_AMBULATORY_CARE_PROVIDER_SITE_OTHER): Payer: BC Managed Care – PPO | Admitting: Internal Medicine

## 2019-07-26 DIAGNOSIS — K219 Gastro-esophageal reflux disease without esophagitis: Secondary | ICD-10-CM | POA: Diagnosis not present

## 2019-07-26 DIAGNOSIS — M5442 Lumbago with sciatica, left side: Secondary | ICD-10-CM | POA: Diagnosis not present

## 2019-07-26 MED ORDER — METHYLPREDNISOLONE 4 MG PO TBPK: ORAL_TABLET | ORAL | 0 refills | Status: DC

## 2019-07-26 NOTE — Progress Notes (Signed)
Patient ID: Gary Fleming, male   DOB: Jun 15, 1981, 38 y.o.   MRN: [id number]   Virtual Visit via telephone Note  This visit type was conducted due to national recommendations for restrictions regarding the COVID-19 pandemic (e.g. social distancing).  This format is felt to be most appropriate for this patient at this time.  All issues noted in this document were discussed and addressed.  No physical exam was performed (except for noted visual exam findings with Video Visits).   I connected with Gary Fleming by telephone and verified that I am speaking with the correct person using two identifiers. Location patient: home Location provider: work  Persons participating in the telephone visit: patient, provider  I discussed the limitations, risks, security and privacy concerns of performing an evaluation and management service by telephone and the availability of in person appointments.  The patient expressed understanding and agreed to proceed.   Reason for visit: work in appt  HPI: Work in appt for back, hip and leg pain.  States he bent over four days ago.  Felt a pulling sensation.  He initially described as a pop, but then stated more of a pull.  Took ibuprofen.  Noticed pain into his left hip.  As day progress - down left leg.  Describes pain as a throbbing/burning pain.  Affects the side and back of his leg.  Extends down leg - just pass the knee.  No numbness or tingling.  Low back - near waist - down to his left hip and then to just below his knee.  Sitting was uncomfortable.  This is better.  Still with increased pain if walks long distance.  Taking ibuprofen 800mg  bid.  Using heating pad.  Is taking protonix bid.  Helping.  No GI issues currently.     ROS: See pertinent positives and negatives per HPI.  Past Medical History:  Diagnosis Date  . Chest discomfort   . Dyspnea on exertion   . GERD (gastroesophageal reflux disease)   . Hyperlipidemia   . Morbid obesity ([place])   .  Overactive bladder     History reviewed. No pertinent surgical history.  Family History  Problem Relation Age of Onset  . Prostate cancer Paternal Grandfather        MI @ 59.  Marland Kitchen Heart disease Paternal Grandfather   . Prostate cancer Paternal Uncle   . Lung cancer Maternal Grandmother   . Hypertension Mother        currently in her 45's  . Hypercholesterolemia Mother   . Diabetes Mother   . Hypertension Father        currently in his 91's  . Hypercholesterolemia Father   . Heart disease Maternal Grandfather        MI in his 55's  . Colon cancer Neg Hx     SOCIAL HX: reviewed.     Current Outpatient Medications:  .  atorvastatin (LIPITOR) 10 MG tablet, TAKE 1 TABLET(10 MG) BY MOUTH DAILY, Disp: 90 tablet, Rfl: 1 .  lisinopril (ZESTRIL) 10 MG tablet, TAKE 1 TABLET(10 MG) BY MOUTH DAILY, Disp: 90 tablet, Rfl: 2 .  Multiple Vitamin (MULTIVITAMIN) tablet, Take 1 tablet by mouth daily., Disp: , Rfl:  .  pantoprazole (PROTONIX) 20 MG tablet, Take 1 tablet (20 mg total) by mouth 2 (two) times daily., Disp: 180 tablet, Rfl: 1 .  cetirizine (ZYRTEC) 10 MG tablet, Take 10 mg daily by mouth., Disp: , Rfl:  .  methylPREDNISolone (MEDROL DOSEPAK) 4 MG TBPK  tablet, Medrol dosepak - 6 day taper.  Take as directed., Disp: 21 tablet, Rfl: 0  EXAM:  GENERAL: alert.  Sounds to be in no acute distress.  Answering questions appropriately.    PSYCH/NEURO: pleasant and cooperative, no obvious depression or anxiety, speech and thought processing grossly intact  ASSESSMENT AND PLAN:  Discussed the following assessment and plan:  GERD (gastroesophageal reflux disease) Doing better on bid dosing - protonix.  Follow.   Back pain Back pain as outlined. Noticed after bending over.  Pain into left hip and left leg.  Taking ibuprofen.  Some better, but persistent pain.  Medrol dose pack as directed.  Discussed possible side effects of medication.  Stop ibuprofen.  Call with update over the next couple  of days.  Follow.      I discussed the assessment and treatment plan with the patient. The patient was provided an opportunity to ask questions and all were answered. The patient agreed with the plan and demonstrated an understanding of the instructions.   The patient was advised to call back or seek an in-person evaluation if the symptoms worsen or if the condition fails to improve as anticipated.  I provided 15 minutes of non-face-to-face time during this encounter.   Dale [place], MD

## 2019-07-28 ENCOUNTER — Encounter: Payer: Self-pay | Admitting: Internal Medicine

## 2019-07-29 ENCOUNTER — Encounter: Payer: Self-pay | Admitting: Internal Medicine

## 2019-07-31 ENCOUNTER — Encounter: Payer: Self-pay | Admitting: Internal Medicine

## 2019-07-31 DIAGNOSIS — M549 Dorsalgia, unspecified: Secondary | ICD-10-CM | POA: Insufficient documentation

## 2019-07-31 NOTE — Assessment & Plan Note (Signed)
Back pain as outlined. Noticed after bending over.  Pain into left hip and left leg.  Taking ibuprofen.  Some better, but persistent pain.  Medrol dose pack as directed.  Discussed possible side effects of medication.  Stop ibuprofen.  Call with update over the next couple of days.  Follow.

## 2019-07-31 NOTE — Assessment & Plan Note (Signed)
Doing better on bid dosing - protonix.  Follow.

## 2019-08-08 ENCOUNTER — Encounter: Payer: Self-pay | Admitting: Internal Medicine

## 2019-08-08 DIAGNOSIS — R200 Anesthesia of skin: Secondary | ICD-10-CM

## 2019-08-08 DIAGNOSIS — R202 Paresthesia of skin: Secondary | ICD-10-CM

## 2019-08-08 DIAGNOSIS — M5442 Lumbago with sciatica, left side: Secondary | ICD-10-CM

## 2019-08-08 NOTE — Telephone Encounter (Signed)
Order placed for ortho referral.   

## 2019-11-04 ENCOUNTER — Ambulatory Visit (INDEPENDENT_AMBULATORY_CARE_PROVIDER_SITE_OTHER): Payer: BC Managed Care – PPO | Admitting: Internal Medicine

## 2019-11-04 ENCOUNTER — Other Ambulatory Visit: Payer: Self-pay

## 2019-11-04 VITALS — BP 130/72 | HR 86 | Temp 98.6°F | Resp 16 | Ht 71.0 in | Wt 282.0 lb

## 2019-11-04 DIAGNOSIS — R109 Unspecified abdominal pain: Secondary | ICD-10-CM | POA: Diagnosis not present

## 2019-11-04 DIAGNOSIS — Z0000 Encounter for general adult medical examination without abnormal findings: Secondary | ICD-10-CM | POA: Diagnosis not present

## 2019-11-04 DIAGNOSIS — I10 Essential (primary) hypertension: Secondary | ICD-10-CM

## 2019-11-04 DIAGNOSIS — R739 Hyperglycemia, unspecified: Secondary | ICD-10-CM | POA: Diagnosis not present

## 2019-11-04 DIAGNOSIS — E7800 Pure hypercholesterolemia, unspecified: Secondary | ICD-10-CM | POA: Diagnosis not present

## 2019-11-04 DIAGNOSIS — K219 Gastro-esophageal reflux disease without esophagitis: Secondary | ICD-10-CM

## 2019-11-04 LAB — HEPATIC FUNCTION PANEL
ALT: 32 U/L (ref 0–53)
AST: 20 U/L (ref 0–37)
Albumin: 4.3 g/dL (ref 3.5–5.2)
Alkaline Phosphatase: 82 U/L (ref 39–117)
Bilirubin, Direct: 0.2 mg/dL (ref 0.0–0.3)
Total Bilirubin: 0.6 mg/dL (ref 0.2–1.2)
Total Protein: 7.4 g/dL (ref 6.0–8.3)

## 2019-11-04 LAB — LIPID PANEL
Cholesterol: 146 mg/dL (ref 0–200)
HDL: 45.9 mg/dL (ref >39.00)
LDL Cholesterol: 83 mg/dL (ref 0–99)
NonHDL: 100.52
Total CHOL/HDL Ratio: 3
Triglycerides: 88 mg/dL (ref 0.0–149.0)
VLDL: 17.6 mg/dL (ref 0.0–40.0)

## 2019-11-04 LAB — BASIC METABOLIC PANEL
BUN: 16 mg/dL (ref 6–23)
CO2: 32 mEq/L (ref 19–32)
Calcium: 9.4 mg/dL (ref 8.4–10.5)
Chloride: 100 mEq/L (ref 96–112)
Creatinine, Ser: 0.88 mg/dL (ref 0.40–1.50)
GFR: 96.68 mL/min (ref >60.00)
Glucose, Bld: 98 mg/dL (ref 70–99)
Potassium: 4.5 mEq/L (ref 3.5–5.1)
Sodium: 136 mEq/L (ref 135–145)

## 2019-11-04 LAB — HEMOGLOBIN A1C: Hgb A1c MFr Bld: 5.5 % (ref 4.6–6.5)

## 2019-11-04 NOTE — Progress Notes (Signed)
Patient ID: Gary Fleming, male   DOB: May 22, 1981, 39 y.o.   MRN: [id number]   Subjective:    Patient ID: Gary Fleming, male    DOB: [birth year], 40 y.o.   MRN: [id number]  HPI  Patient here for his physical exam. Recently had cardiac w/up.  Stress echo - no ischemia.  EF 55-60%.  Cardiac w/up ok.  No chest pain or sob reported.  Still having GI issues.  Reports increased bloating and nausea.  Has to take MOM.  If takes more often - hurts his stomach.  He is eating.  No vomiting.  Still with the intermittent pain and discomfort.  No fever.  No cough or congestion.   Past Medical History:  Diagnosis Date  . Chest discomfort   . Dyspnea on exertion   . GERD (gastroesophageal reflux disease)   . Hyperlipidemia   . Morbid obesity ([place])   . Overactive bladder    History reviewed. No pertinent surgical history. Family History  Problem Relation Age of Onset  . Prostate cancer Paternal Grandfather        MI @ 41.  Marland Kitchen Heart disease Paternal Grandfather   . Prostate cancer Paternal Uncle   . Lung cancer Maternal Grandmother   . Hypertension Mother        currently in her 75's  . Hypercholesterolemia Mother   . Diabetes Mother   . Hypertension Father        currently in his 57's  . Hypercholesterolemia Father   . Heart disease Maternal Grandfather        MI in his 82's  . Colon cancer Neg Hx    Social History   Socioeconomic History  . Marital status: Single    Spouse name: Not on file  . Number of children: Not on file  . Years of education: Not on file  . Highest education level: Not on file  Occupational History  . Occupation: Energy manager:  [employer]  Tobacco Use  . Smoking status: Never Smoker  . Smokeless tobacco: Current User    Types: Chew  Substance and Sexual Activity  . Alcohol use: Yes    Alcohol/week: 0.0 standard drinks    Comment: three to four beers a month.  . Drug use: No  . Sexual activity: Not on file  Other Topics  Concern  . Not on file  Social History Narrative   Lives in [place] with family.  Single.  No children.  Does routinely exercise - wt training, treadmill.   Social Determinants of Health   Financial Resource Strain:   . Difficulty of Paying Living Expenses:   Food Insecurity:   . Worried About Charity fundraiser in the Last Year:   . Arboriculturist in the Last Year:   Transportation Needs:   . Film/video editor (Medical):   Marland Kitchen Lack of Transportation (Non-Medical):   Physical Activity:   . Days of Exercise per Week:   . Minutes of Exercise per Session:   Stress:   . Feeling of Stress :   Social Connections:   . Frequency of Communication with Friends and Family:   . Frequency of Social Gatherings with Friends and Family:   . Attends Religious Services:   . Active Member of Clubs or Organizations:   . Attends Archivist Meetings:   Marland Kitchen Marital Status:     Outpatient Encounter Medications as of 11/04/2019  Medication Sig  .  atorvastatin (LIPITOR) 10 MG tablet TAKE 1 TABLET(10 MG) BY MOUTH DAILY  . lisinopril (ZESTRIL) 10 MG tablet TAKE 1 TABLET(10 MG) BY MOUTH DAILY  . Multiple Vitamin (MULTIVITAMIN) tablet Take 1 tablet by mouth daily.  . pantoprazole (PROTONIX) 20 MG tablet Take 1 tablet (20 mg total) by mouth 2 (two) times daily.  . [DISCONTINUED] cetirizine (ZYRTEC) 10 MG tablet Take 10 mg daily by mouth.  . [DISCONTINUED] methylPREDNISolone (MEDROL DOSEPAK) 4 MG TBPK tablet Medrol dosepak - 6 day taper.  Take as directed.   No facility-administered encounter medications on file as of 11/04/2019.    Review of Systems  Constitutional: Negative for appetite change and fever.  HENT: Negative for congestion, sinus pressure and sore throat.   Eyes: Negative for discharge.  Respiratory: Negative for cough, chest tightness and shortness of breath.   Gastrointestinal: Positive for nausea. Negative for diarrhea and vomiting.       Abdominal discomfort and  bloating.    Genitourinary: Negative for difficulty urinating and dysuria.  Musculoskeletal: Negative for joint swelling and myalgias.  Skin: Negative for color change and rash.  Neurological: Negative for dizziness, light-headedness and headaches.  Psychiatric/Behavioral: Negative for agitation and dysphoric mood.       Objective:    Physical Exam Constitutional:      General: He is not in acute distress.    Appearance: Normal appearance. He is well-developed.  HENT:     Head: Normocephalic and atraumatic.     Right Ear: External ear normal.     Left Ear: External ear normal.     Mouth/Throat:     Pharynx: No oropharyngeal exudate.  Eyes:     General: No scleral icterus.       Right eye: No discharge.        Left eye: No discharge.     Conjunctiva/sclera: Conjunctivae normal.  Neck:     Thyroid: No thyromegaly.  Cardiovascular:     Rate and Rhythm: Normal rate and regular rhythm.  Pulmonary:     Effort: No respiratory distress.     Breath sounds: Normal breath sounds. No wheezing.  Abdominal:     General: Bowel sounds are normal.     Palpations: Abdomen is soft.     Tenderness: There is no abdominal tenderness.  Genitourinary:    Comments: Not performed.  Musculoskeletal:        General: No swelling or tenderness.     Cervical back: Neck supple. No tenderness.  Lymphadenopathy:     Cervical: No cervical adenopathy.  Skin:    Findings: No erythema or rash.  Neurological:     Mental Status: He is alert and oriented to person, place, and time.  Psychiatric:        Mood and Affect: Mood normal.        Behavior: Behavior normal.     BP 130/72   Pulse 86   Temp 98.6 F (37 C)   Resp 16   Ht 5' 11"  (1.803 m)   Wt 282 lb (127.9 kg)   SpO2 97%   BMI 39.33 kg/m  Wt Readings from Last 3 Encounters:  11/04/19 282 lb (127.9 kg)  07/26/19 287 lb (130.2 kg)  07/01/19 287 lb (130.2 kg)     Lab Results  Component Value Date   WBC 9.5 07/01/2019   HGB 14.7  07/01/2019   HCT 43.8 07/01/2019   PLT 365.0 07/01/2019   GLUCOSE 98 11/04/2019   CHOL 146 11/04/2019   TRIG  88.0 11/04/2019   HDL 45.90 11/04/2019   LDLDIRECT 159.0 11/06/2014   LDLCALC 83 11/04/2019   ALT 32 11/04/2019   AST 20 11/04/2019   NA 136 11/04/2019   K 4.5 11/04/2019   CL 100 11/04/2019   CREATININE 0.88 11/04/2019   BUN 16 11/04/2019   CO2 32 11/04/2019   TSH 1.87 07/01/2019   HGBA1C 5.5 11/04/2019    US Abdomen Complete  Result Date: 02/04/2019 CLINICAL DATA:  Abdominal pain and bloating for several months EXAM: ABDOMEN ULTRASOUND COMPLETE COMPARISON:  None. FINDINGS: Gallbladder: No gallstones or wall thickening visualized. No sonographic Murphy sign noted by sonographer. Common bile duct: Diameter: 1.7 mm. Liver: No focal lesion identified. Within normal limits in parenchymal echogenicity. Portal vein is patent on color Doppler imaging with normal direction of blood flow towards the liver. IVC: No abnormality visualized. Pancreas: Visualized portion unremarkable. Spleen: Size and appearance within normal limits. Right Kidney: Length: 13.3 cm. Echogenicity within normal limits. No mass or hydronephrosis visualized. Left Kidney: Length: 14.4 cm. Echogenicity within normal limits. No mass or hydronephrosis visualized. Abdominal aorta: No aneurysm visualized. Other findings: None. IMPRESSION: No acute abnormality noted. Electronically Signed   By: Inez Catalina M.D.   On: 02/04/2019 12:06       Assessment & Plan:   Problem List Items Addressed This Visit    Abdominal pain    Abdominal pain and bloating as outlined.  Persistent issues with stool.  Takes MOM and GasX.  Some nausea.  Has seen GI.  Given persistent pain and symptoms, obtain CT abdomen and pelvis.  Will need f/u with GI.        Relevant Orders   CT Abdomen Pelvis W Contrast   GERD (gastroesophageal reflux disease)    No acid reflux.  On protonix.        Health care maintenance    Physical today 11/04/19.   Followed by urology.        Hypercholesterolemia    On lipitor.  Low cholesterol diet and exercise.  Follow lipid panel and liver function tests.        Relevant Orders   Hepatic function panel (Completed)   Lipid panel (Completed)   Hyperglycemia    Low carb diet and exercise.  Follow met b and a1c.        Relevant Orders   Hemoglobin A1c (Completed)   Hypertension, essential    Blood pressure under good control.  Continue same medication regimen - lisinopril.    Follow pressures.  Follow metabolic panel.        Relevant Orders   Basic metabolic panel (Completed)    Other Visit Diagnoses    Routine general medical examination at a health care facility    -  Primary       Einar Pheasant, MD

## 2019-11-04 NOTE — Assessment & Plan Note (Addendum)
Physical today 11/04/19.  Followed by urology.

## 2019-11-07 ENCOUNTER — Encounter: Payer: Self-pay | Admitting: Internal Medicine

## 2019-11-12 ENCOUNTER — Encounter: Payer: Self-pay | Admitting: Internal Medicine

## 2019-11-12 NOTE — Assessment & Plan Note (Signed)
No acid reflux.  On protonix.  

## 2019-11-12 NOTE — Assessment & Plan Note (Signed)
On lipitor.  Low cholesterol diet and exercise.  Follow lipid panel and liver function tests.   

## 2019-11-12 NOTE — Assessment & Plan Note (Signed)
Blood pressure under good control.  Continue same medication regimen - lisinopril.   Follow pressures.  Follow metabolic panel.   

## 2019-11-12 NOTE — Assessment & Plan Note (Signed)
Abdominal pain and bloating as outlined.  Persistent issues with stool.  Takes MOM and GasX.  Some nausea.  Has seen GI.  Given persistent pain and symptoms, obtain CT abdomen and pelvis.  Will need f/u with GI.

## 2019-11-12 NOTE — Assessment & Plan Note (Signed)
Low carb diet and exercise.  Follow met b and a1c.   

## 2019-11-16 ENCOUNTER — Encounter: Payer: Self-pay | Admitting: Internal Medicine

## 2019-11-27 ENCOUNTER — Encounter: Payer: Self-pay | Admitting: Internal Medicine

## 2019-12-06 ENCOUNTER — Other Ambulatory Visit: Payer: Self-pay

## 2019-12-06 ENCOUNTER — Encounter: Payer: Self-pay | Admitting: Internal Medicine

## 2019-12-06 ENCOUNTER — Ambulatory Visit
Admission: RE | Admit: 2019-12-06 | Discharge: 2019-12-06 | Disposition: A | Discharge status: Home / Self Care | Payer: BC Managed Care – PPO | Source: Ambulatory Visit | Attending: Internal Medicine | Admitting: Internal Medicine

## 2019-12-06 DIAGNOSIS — R935 Abnormal findings on diagnostic imaging of other abdominal regions, including retroperitoneum: Secondary | ICD-10-CM

## 2019-12-06 DIAGNOSIS — R109 Unspecified abdominal pain: Secondary | ICD-10-CM | POA: Diagnosis present

## 2019-12-06 HISTORY — DX: Essential (primary) hypertension: I10

## 2019-12-06 MED ORDER — IOHEXOL 300 MG/ML  SOLN
100.0000 mL | Freq: Once | INTRAMUSCULAR | Status: AC | PRN
  Administered 2019-12-06: 100 mL via INTRAVENOUS

## 2019-12-07 NOTE — Telephone Encounter (Signed)
Order placed for GI referral.   

## 2019-12-14 ENCOUNTER — Encounter: Payer: Self-pay | Admitting: Internal Medicine

## 2019-12-15 ENCOUNTER — Other Ambulatory Visit: Payer: Self-pay

## 2019-12-15 MED ORDER — ATORVASTATIN CALCIUM 10 MG PO TABS: ORAL_TABLET | ORAL | 2 refills | Status: DC

## 2019-12-20 ENCOUNTER — Encounter: Payer: Self-pay | Admitting: Internal Medicine

## 2019-12-27 ENCOUNTER — Telehealth: Payer: Self-pay | Admitting: Internal Medicine

## 2019-12-27 NOTE — Telephone Encounter (Signed)
PT called wants a referral for [facility] appt 02-14-20 fax number is [phone]

## 2019-12-27 NOTE — Telephone Encounter (Signed)
Can you send his referral to this office?

## 2019-12-30 NOTE — Telephone Encounter (Signed)
Good morning!  Referral ofc notes ins card demo was fax to number [phone].

## 2019-12-30 NOTE — Telephone Encounter (Signed)
Noted  

## 2020-03-06 ENCOUNTER — Telehealth: Payer: Self-pay | Admitting: Internal Medicine

## 2020-03-06 NOTE — Telephone Encounter (Signed)
Gary Fleming with [facility] called and wants the CT scan results/report on abdomen . Fax number is [phone]

## 2020-03-07 NOTE — Telephone Encounter (Signed)
Faxed to [facility]

## 2020-03-09 ENCOUNTER — Ambulatory Visit: Payer: BC Managed Care – PPO | Admitting: Internal Medicine

## 2020-03-17 ENCOUNTER — Encounter: Payer: Self-pay | Admitting: Internal Medicine

## 2020-03-19 ENCOUNTER — Other Ambulatory Visit: Payer: Self-pay

## 2020-03-19 MED ORDER — LISINOPRIL 10 MG PO TABS: ORAL_TABLET | ORAL | 2 refills | Status: DC

## 2020-03-22 ENCOUNTER — Other Ambulatory Visit: Payer: Self-pay | Admitting: Internal Medicine

## 2020-03-23 ENCOUNTER — Encounter: Payer: Self-pay | Admitting: Internal Medicine

## 2020-03-23 ENCOUNTER — Other Ambulatory Visit: Payer: Self-pay

## 2020-03-23 ENCOUNTER — Ambulatory Visit (INDEPENDENT_AMBULATORY_CARE_PROVIDER_SITE_OTHER): Payer: BC Managed Care – PPO | Admitting: Internal Medicine

## 2020-03-23 VITALS — BP 128/82 | HR 80 | Temp 98.1°F | Resp 16 | Ht 71.0 in | Wt 285.0 lb

## 2020-03-23 DIAGNOSIS — I10 Essential (primary) hypertension: Secondary | ICD-10-CM | POA: Diagnosis not present

## 2020-03-23 DIAGNOSIS — E7800 Pure hypercholesterolemia, unspecified: Secondary | ICD-10-CM | POA: Diagnosis not present

## 2020-03-23 DIAGNOSIS — G4730 Sleep apnea, unspecified: Secondary | ICD-10-CM

## 2020-03-23 DIAGNOSIS — R739 Hyperglycemia, unspecified: Secondary | ICD-10-CM

## 2020-03-23 DIAGNOSIS — R109 Unspecified abdominal pain: Secondary | ICD-10-CM

## 2020-03-23 LAB — HEPATIC FUNCTION PANEL
ALT: 41 U/L (ref 0–53)
AST: 25 U/L (ref 0–37)
Albumin: 4.8 g/dL (ref 3.5–5.2)
Alkaline Phosphatase: 81 U/L (ref 39–117)
Bilirubin, Direct: 0.2 mg/dL (ref 0.0–0.3)
Total Bilirubin: 0.8 mg/dL (ref 0.2–1.2)
Total Protein: 7.4 g/dL (ref 6.0–8.3)

## 2020-03-23 LAB — BASIC METABOLIC PANEL
BUN: 8 mg/dL (ref 6–23)
CO2: 31 mEq/L (ref 19–32)
Calcium: 9.7 mg/dL (ref 8.4–10.5)
Chloride: 101 mEq/L (ref 96–112)
Creatinine, Ser: 0.91 mg/dL (ref 0.40–1.50)
GFR: 92.82 mL/min (ref >60.00)
Glucose, Bld: 74 mg/dL (ref 70–99)
Potassium: 4.7 mEq/L (ref 3.5–5.1)
Sodium: 138 mEq/L (ref 135–145)

## 2020-03-23 LAB — LIPID PANEL
Cholesterol: 156 mg/dL (ref 0–200)
HDL: 56.1 mg/dL (ref >39.00)
LDL Cholesterol: 72 mg/dL (ref 0–99)
NonHDL: 100.03
Total CHOL/HDL Ratio: 3
Triglycerides: 138 mg/dL (ref 0.0–149.0)
VLDL: 27.6 mg/dL (ref 0.0–40.0)

## 2020-03-23 LAB — HEMOGLOBIN A1C: Hgb A1c MFr Bld: 5.6 % (ref 4.6–6.5)

## 2020-03-23 NOTE — Progress Notes (Signed)
Patient ID: Gary Fleming, male   DOB: December 20, 1980, 39 y.o.   MRN: [id number]   Subjective:    Patient ID: Gary Fleming, male    DOB: Jun 14, 1981, 39 y.o.   MRN: [id number]  HPI This visit occurred during the SARS-CoV-2 public health emergency.  Safety protocols were in place, including screening questions prior to the visit, additional usage of staff PPE, and extensive cleaning of exam room while observing appropriate contact time as indicated for disinfecting solutions.  Patient here for a scheduled follow up.  He is seeing GI.  Is s/p EGD (Dr Birdie Hopes) - normal esophagus, mild erosive gastropathy.  protonix stopped.  Takes pepcid prn.  No acid reflux.  Eating.  Still with abdominal discomfort.  Started on Linzess.  First week - "worked too well".  Had 6-7 stools per day.  No abdominal pain during this week.  Second week, had 3 stools per day.  Noticed pain started to come back.  No as severe.  Planning for colonoscopy in September.  Planning to see pulmonary prior to colonoscopy.  States otherwise doing well.  No chest pain.  Breathing stable.  No blood.    Past Medical History:  Diagnosis Date  . Chest discomfort   . Dyspnea on exertion   . GERD (gastroesophageal reflux disease)   . Hyperlipidemia   . Hypertension   . Morbid obesity ([place])   . Overactive bladder    History reviewed. No pertinent surgical history. Family History  Problem Relation Age of Onset  . Prostate cancer Paternal Grandfather        MI @ 85.  Marland Kitchen Heart disease Paternal Grandfather   . Prostate cancer Paternal Uncle   . Lung cancer Maternal Grandmother   . Hypertension Mother        currently in her 74's  . Hypercholesterolemia Mother   . Diabetes Mother   . Hypertension Father        currently in his 59's  . Hypercholesterolemia Father   . Heart disease Maternal Grandfather        MI in his 72's  . Colon cancer Neg Hx    Social History   Socioeconomic History  . Marital status: Single     Spouse name: Not on file  . Number of children: Not on file  . Years of education: Not on file  . Highest education level: Not on file  Occupational History  . Occupation: Energy manager: [employer]  Tobacco Use  . Smoking status: Never Smoker  . Smokeless tobacco: Current User    Types: Chew  Vaping Use  . Vaping Use: Never used  Substance and Sexual Activity  . Alcohol use: Yes    Alcohol/week: 0.0 standard drinks    Comment: three to four beers a month.  . Drug use: No  . Sexual activity: Not on file  Other Topics Concern  . Not on file  Social History Narrative   Lives in [place] with family.  Single.  No children.  Does routinely exercise - wt training, treadmill.   Social Determinants of Health   Financial Resource Strain:   . Difficulty of Paying Living Expenses:   Food Insecurity:   . Worried About Charity fundraiser in the Last Year:   . Arboriculturist in the Last Year:   Transportation Needs:   . Film/video editor (Medical):   Marland Kitchen Lack of Transportation (Non-Medical):   Physical  Activity:   . Days of Exercise per Week:   . Minutes of Exercise per Session:   Stress:   . Feeling of Stress :   Social Connections:   . Frequency of Communication with Friends and Family:   . Frequency of Social Gatherings with Friends and Family:   . Attends Religious Services:   . Active Member of Clubs or Organizations:   . Attends Archivist Meetings:   Marland Kitchen Marital Status:     Outpatient Encounter Medications as of 03/23/2020  Medication Sig  . atorvastatin (LIPITOR) 10 MG tablet TAKE 1 TABLET(10 MG) BY MOUTH DAILY  . LINZESS 290 MCG CAPS capsule Take 290 mcg by mouth daily.  Marland Kitchen lisinopril (ZESTRIL) 10 MG tablet TAKE 1 TABLET(10 MG) BY MOUTH DAILY  . Multiple Vitamin (MULTIVITAMIN) tablet Take 1 tablet by mouth daily.  . [DISCONTINUED] pantoprazole (PROTONIX) 20 MG tablet Take 1 tablet (20 mg total) by mouth 2 (two) times daily.    No facility-administered encounter medications on file as of 03/23/2020.    Review of Systems  Constitutional: Negative for appetite change and unexpected weight change.  HENT: Negative for congestion and sinus pressure.   Respiratory: Negative for cough, chest tightness and shortness of breath.   Cardiovascular: Negative for chest pain, palpitations and leg swelling.  Gastrointestinal: Positive for abdominal pain. Negative for nausea and vomiting.       Bowels as outlined.    Genitourinary: Negative for difficulty urinating and dysuria.  Musculoskeletal: Negative for joint swelling and myalgias.  Skin: Negative for color change and rash.  Neurological: Negative for dizziness, light-headedness and headaches.  Psychiatric/Behavioral: Negative for agitation and dysphoric mood.       Objective:    Physical Exam Vitals reviewed.  Constitutional:      General: He is not in acute distress.    Appearance: Normal appearance. He is well-developed.  HENT:     Head: Normocephalic and atraumatic.     Right Ear: External ear normal.     Left Ear: External ear normal.  Eyes:     General: No scleral icterus.       Right eye: No discharge.        Left eye: No discharge.     Conjunctiva/sclera: Conjunctivae normal.  Cardiovascular:     Rate and Rhythm: Normal rate and regular rhythm.  Pulmonary:     Effort: Pulmonary effort is normal. No respiratory distress.     Breath sounds: Normal breath sounds.  Abdominal:     General: Bowel sounds are normal.     Palpations: Abdomen is soft.     Tenderness: There is no abdominal tenderness.  Musculoskeletal:        General: No swelling or tenderness.     Cervical back: Neck supple. No tenderness.  Lymphadenopathy:     Cervical: No cervical adenopathy.  Skin:    Findings: No erythema or rash.  Neurological:     Mental Status: He is alert.  Psychiatric:        Mood and Affect: Mood normal.        Behavior: Behavior normal.     BP  128/82   Pulse 80   Temp 98.1 F (36.7 C)   Resp 16   Ht 5' 11" (1.803 m)   Wt (!) 285 lb (129.3 kg)   SpO2 98%   BMI 39.75 kg/m  Wt Readings from Last 3 Encounters:  03/23/20 (!) 285 lb (129.3 kg)  11/04/19 282 lb (127.9  kg)  07/26/19 287 lb (130.2 kg)     Lab Results  Component Value Date   WBC 9.5 07/01/2019   HGB 14.7 07/01/2019   HCT 43.8 07/01/2019   PLT 365.0 07/01/2019   GLUCOSE 74 03/23/2020   CHOL 156 03/23/2020   TRIG 138.0 03/23/2020   HDL 56.10 03/23/2020   LDLDIRECT 159.0 11/06/2014   LDLCALC 72 03/23/2020   ALT 41 03/23/2020   AST 25 03/23/2020   NA 138 03/23/2020   K 4.7 03/23/2020   CL 101 03/23/2020   CREATININE 0.91 03/23/2020   BUN 8 03/23/2020   CO2 31 03/23/2020   TSH 1.87 07/01/2019   HGBA1C 5.6 03/23/2020    CT Abdomen Pelvis W Contrast  Result Date: 12/06/2019 CLINICAL DATA:  Mid abdominal pain with constipation for 6 months EXAM: CT ABDOMEN AND PELVIS WITH CONTRAST TECHNIQUE: Multidetector CT imaging of the abdomen and pelvis was performed using the standard protocol following bolus administration of intravenous contrast. CONTRAST:  134m OMNIPAQUE IOHEXOL 300 MG/ML  SOLN COMPARISON:  Abdominal ultrasound 02/04/2019 FINDINGS: Lower chest: No acute abnormality. Hepatobiliary: No focal liver abnormality is seen. No gallstones, gallbladder wall thickening, or biliary dilatation. Pancreas: Unremarkable. No pancreatic ductal dilatation or surrounding inflammatory changes. Spleen: Normal in size without focal abnormality. Adrenals/Urinary Tract: Adrenal glands are unremarkable. Kidneys are normal, without renal calculi, focal lesion, or hydronephrosis. Bladder is decompressed. Stomach/Bowel: Stomach and bowel are well distended with oral contrast. Stomach is within normal limits. Appendix appears normal. Long segment mild circumferential wall thickening of the descending and sigmoid colon (series 4, image 77). There are a few scattered diverticula. No  short segment bowel wall thickening. No dilated loops of bowel. No pericolonic inflammatory changes. Mild-moderate volume of stool throughout the colon. Vascular/Lymphatic: No significant vascular findings are present. No enlarged abdominal or pelvic lymph nodes. Reproductive: Prostate is unremarkable. Other: No abdominal wall hernia or abnormality. No abdominopelvic ascites. Musculoskeletal: Transitional lumbosacral anatomy with partial lumbarization of the S1 segment. No acute osseous findings. IMPRESSION: 1. Long segment mild circumferential wall thickening of the descending and sigmoid colon, which may represent a mild infectious or inflammatory colitis. 2. Mild-moderate volume of stool throughout the colon. Electronically Signed   By: NDavina PokeD.O.   On: 12/06/2019 13:46       Assessment & Plan:   Problem List Items Addressed This Visit    Abdominal pain    Abdominal pain as outlined.  CT as outlined.  Seeing GI.  EGD as outlined.  Off protonix.  On linzess.  States when stomach is empty, no pain.  Planning for colonoscopy. Plans to call GI with update.       Hypercholesterolemia - Primary    On lipitor.  Low cholesterol diet and exercise.  Follow lipid panel and liver function tests.        Relevant Orders   Hepatic function panel (Completed)   Lipid panel (Completed)   Hyperglycemia    Low carb diet and exercise.  Follow met b and a1c.       Relevant Orders   Hemoglobin A1c (Completed)   Hypertension, essential    Blood pressure under good control.  Continue lisinopril.  Follow pressures.  Follow metabolic panel.        Relevant Orders   Basic metabolic panel (Completed)   Sleep apnea    Continue cpap.           CEinar Pheasant MD

## 2020-03-24 ENCOUNTER — Encounter: Payer: Self-pay | Admitting: Internal Medicine

## 2020-03-24 NOTE — Assessment & Plan Note (Signed)
Low carb diet and exercise.  Follow met b and a1c.  

## 2020-03-24 NOTE — Assessment & Plan Note (Signed)
Abdominal pain as outlined.  CT as outlined.  Seeing GI.  EGD as outlined.  Off protonix.  On linzess.  States when stomach is empty, no pain.  Planning for colonoscopy. Plans to call GI with update.

## 2020-03-24 NOTE — Assessment & Plan Note (Signed)
Continue cpap.  

## 2020-03-24 NOTE — Assessment & Plan Note (Signed)
On lipitor.  Low cholesterol diet and exercise.  Follow lipid panel and liver function tests.   

## 2020-03-24 NOTE — Assessment & Plan Note (Signed)
Blood pressure under good control.  Continue lisinopril.  Follow pressures.  Follow metabolic panel.  

## 2020-03-27 ENCOUNTER — Encounter: Payer: Self-pay | Admitting: Internal Medicine

## 2020-03-29 NOTE — Telephone Encounter (Signed)
Need more information.  Who was he seeing when home study performed.  Does he know what company he gets supplies - they may have copy of sleep study

## 2020-04-23 ENCOUNTER — Encounter: Payer: Self-pay | Admitting: Internal Medicine

## 2020-05-04 ENCOUNTER — Telehealth: Payer: BC Managed Care – PPO | Admitting: Internal Medicine

## 2020-07-27 ENCOUNTER — Ambulatory Visit: Payer: BC Managed Care – PPO | Admitting: Internal Medicine

## 2020-07-27 ENCOUNTER — Other Ambulatory Visit: Payer: Self-pay

## 2020-07-27 DIAGNOSIS — R739 Hyperglycemia, unspecified: Secondary | ICD-10-CM | POA: Diagnosis not present

## 2020-07-27 DIAGNOSIS — G4730 Sleep apnea, unspecified: Secondary | ICD-10-CM | POA: Diagnosis not present

## 2020-07-27 DIAGNOSIS — E7800 Pure hypercholesterolemia, unspecified: Secondary | ICD-10-CM

## 2020-07-27 DIAGNOSIS — I10 Essential (primary) hypertension: Secondary | ICD-10-CM | POA: Diagnosis not present

## 2020-07-27 DIAGNOSIS — R109 Unspecified abdominal pain: Secondary | ICD-10-CM

## 2020-07-27 MED ORDER — AZELASTINE-FLUTICASONE 137-50 MCG/ACT NA SUSP: NASAL | 3 refills | Status: DC

## 2020-07-27 NOTE — Progress Notes (Signed)
Patient ID: Gary Fleming, male   DOB: 07/04/1981, 39 y.o.   MRN: [id number]   Subjective:    Patient ID: Gary Fleming, male    DOB: 01/13/1981, 39 y.o.   MRN: [id number]  HPI This visit occurred during the SARS-CoV-2 public health emergency.  Safety protocols were in place, including screening questions prior to the visit, additional usage of staff PPE, and extensive cleaning of exam room while observing appropriate contact time as indicated for disinfecting solutions.  Patient here for scheduled follow up.  Has seen GI.  S/p EGD - mild erosive gastropathy.  No acid reflux reported.  Colonoscopy 05/02/20 - congested mucosa in the rectosigmoid colon, diverticulosis and internal hemorrhoids.  On linzess.  Adjusting dose.  Is doing better.  Feels better.  No chest pain or sob reported.  No cough or congestion reported.  Overall doing well.  Some persistent allergy symptoms despite flonase.    Past Medical History:  Diagnosis Date  . Chest discomfort   . Dyspnea on exertion   . GERD (gastroesophageal reflux disease)   . Hyperlipidemia   . Hypertension   . Morbid obesity ([facility])   . Overactive bladder    History reviewed. No pertinent surgical history. Family History  Problem Relation Age of Onset  . Prostate cancer Paternal Grandfather        MI @ 40.  . Heart disease Paternal Grandfather   . Prostate cancer Paternal Uncle   . Lung cancer Maternal Grandmother   . Hypertension Mother        currently in her 50's  . Hypercholesterolemia Mother   . Diabetes Mother   . Hypertension Father        currently in his 50's  . Hypercholesterolemia Father   . Heart disease Maternal Grandfather        MI in his 70's  . Colon cancer Neg Hx    Social History   Socioeconomic History  . Marital status: Single    Spouse name: Not on file  . Number of children: Not on file  . Years of education: Not on file  . Highest education level: Not on file  Occupational History  . Occupation: Bridge  Inspector    Employer: [employer]  Tobacco Use  . Smoking status: Never Smoker  . Smokeless tobacco: Current User    Types: Chew  Vaping Use  . Vaping Use: Never used  Substance and Sexual Activity  . Alcohol use: Yes    Alcohol/week: 0.0 standard drinks    Comment: three to four beers a month.  . Drug use: No  . Sexual activity: Not on file  Other Topics Concern  . Not on file  Social History Narrative   Lives in [place] with family.  Single.  No children.  Does routinely exercise - wt training, treadmill.   Social Determinants of Health   Financial Resource Strain:   . Difficulty of Paying Living Expenses: Not on file  Food Insecurity:   . Worried About Running Out of Food in the Last Year: Not on file  . Ran Out of Food in the Last Year: Not on file  Transportation Needs:   . Lack of Transportation (Medical): Not on file  . Lack of Transportation (Non-Medical): Not on file  Physical Activity:   . Days of Exercise per Week: Not on file  . Minutes of Exercise per Session: Not on file  Stress:   . Feeling of Stress : Not   on file  Social Connections:   . Frequency of Communication with Friends and Family: Not on file  . Frequency of Social Gatherings with Friends and Family: Not on file  . Attends Religious Services: Not on file  . Active Member of Clubs or Organizations: Not on file  . Attends Archivist Meetings: Not on file  . Marital Status: Not on file    Outpatient Encounter Medications as of 07/27/2020  Medication Sig  . atorvastatin (LIPITOR) 10 MG tablet TAKE 1 TABLET(10 MG) BY MOUTH DAILY  . Azelastine-Fluticasone 137-50 MCG/ACT SUSP 1 spray each nostril bid  . LINZESS 290 MCG CAPS capsule Take 290 mcg by mouth daily.  Marland Kitchen lisinopril (ZESTRIL) 10 MG tablet TAKE 1 TABLET(10 MG) BY MOUTH DAILY  . Multiple Vitamin (MULTIVITAMIN) tablet Take 1 tablet by mouth daily.   No facility-administered encounter medications on file as of  07/27/2020.    Review of Systems     Objective:    Physical Exam  BP 122/70   Pulse 100   Temp 98.5 F (36.9 C) (Oral)   Resp 16   Ht 5' 11" (1.803 m)   Wt 286 lb (129.7 kg)   SpO2 99%   BMI 39.89 kg/m  Wt Readings from Last 3 Encounters:  07/27/20 286 lb (129.7 kg)  03/23/20 (!) 285 lb (129.3 kg)  11/04/19 282 lb (127.9 kg)     Lab Results  Component Value Date   WBC 9.5 07/01/2019   HGB 14.7 07/01/2019   HCT 43.8 07/01/2019   PLT 365.0 07/01/2019   GLUCOSE 74 03/23/2020   CHOL 156 03/23/2020   TRIG 138.0 03/23/2020   HDL 56.10 03/23/2020   LDLDIRECT 159.0 11/06/2014   LDLCALC 72 03/23/2020   ALT 41 03/23/2020   AST 25 03/23/2020   NA 138 03/23/2020   K 4.7 03/23/2020   CL 101 03/23/2020   CREATININE 0.91 03/23/2020   BUN 8 03/23/2020   CO2 31 03/23/2020   TSH 1.87 07/01/2019   HGBA1C 5.6 03/23/2020    CT Abdomen Pelvis W Contrast  Result Date: 12/06/2019 CLINICAL DATA:  Mid abdominal pain with constipation for 6 months EXAM: CT ABDOMEN AND PELVIS WITH CONTRAST TECHNIQUE: Multidetector CT imaging of the abdomen and pelvis was performed using the standard protocol following bolus administration of intravenous contrast. CONTRAST:  144m OMNIPAQUE IOHEXOL 300 MG/ML  SOLN COMPARISON:  Abdominal ultrasound 02/04/2019 FINDINGS: Lower chest: No acute abnormality. Hepatobiliary: No focal liver abnormality is seen. No gallstones, gallbladder wall thickening, or biliary dilatation. Pancreas: Unremarkable. No pancreatic ductal dilatation or surrounding inflammatory changes. Spleen: Normal in size without focal abnormality. Adrenals/Urinary Tract: Adrenal glands are unremarkable. Kidneys are normal, without renal calculi, focal lesion, or hydronephrosis. Bladder is decompressed. Stomach/Bowel: Stomach and bowel are well distended with oral contrast. Stomach is within normal limits. Appendix appears normal. Long segment mild circumferential wall thickening of the descending  and sigmoid colon (series 4, image 77). There are a few scattered diverticula. No short segment bowel wall thickening. No dilated loops of bowel. No pericolonic inflammatory changes. Mild-moderate volume of stool throughout the colon. Vascular/Lymphatic: No significant vascular findings are present. No enlarged abdominal or pelvic lymph nodes. Reproductive: Prostate is unremarkable. Other: No abdominal wall hernia or abnormality. No abdominopelvic ascites. Musculoskeletal: Transitional lumbosacral anatomy with partial lumbarization of the S1 segment. No acute osseous findings. IMPRESSION: 1. Long segment mild circumferential wall thickening of the descending and sigmoid colon, which may represent a mild infectious or inflammatory colitis.  2. Mild-moderate volume of stool throughout the colon. Electronically Signed   By: Nicholas  Plundo D.O.   On: 12/06/2019 13:46       Assessment & Plan:   Problem List Items Addressed This Visit    Sleep apnea    Continue cpap.       Hypertension, essential    Blood pressure doing well. On lisinopril.  Follow pressures.  Follow metabolic panel.       Relevant Orders   CBC with Differential/Platelet   TSH   Basic metabolic panel   Hyperglycemia    Low carb diet and exercise.  Follow met b and a1c.       Relevant Orders   Hemoglobin A1c   Hypercholesterolemia    On lipitor.  Low cholesterol diet and exercise.  Follow lipid panel and liver function tests.        Relevant Orders   Hepatic function panel   Lipid panel   Abdominal pain    Has seen GI.  W/up as outlined.  Trying to adjust linzess dose.  Doing better.  Follow.            Charlene Scott, MD 

## 2020-07-28 ENCOUNTER — Encounter: Payer: Self-pay | Admitting: Internal Medicine

## 2020-07-28 NOTE — Assessment & Plan Note (Signed)
Blood pressure doing well.  On lisinopril.  Follow pressures.  Follow metabolic panel.  

## 2020-07-28 NOTE — Assessment & Plan Note (Signed)
On lipitor.  Low cholesterol diet and exercise.  Follow lipid panel and liver function tests.   

## 2020-07-28 NOTE — Assessment & Plan Note (Signed)
Has seen GI.  W/up as outlined.  Trying to adjust linzess dose.  Doing better.  Follow.

## 2020-07-28 NOTE — Assessment & Plan Note (Signed)
Low carb diet and exercise.  Follow met b and a1c.  

## 2020-07-28 NOTE — Assessment & Plan Note (Signed)
Continue cpap.  

## 2020-08-31 ENCOUNTER — Other Ambulatory Visit (INDEPENDENT_AMBULATORY_CARE_PROVIDER_SITE_OTHER): Payer: BC Managed Care – PPO

## 2020-08-31 ENCOUNTER — Other Ambulatory Visit: Payer: Self-pay

## 2020-08-31 DIAGNOSIS — E7800 Pure hypercholesterolemia, unspecified: Secondary | ICD-10-CM | POA: Diagnosis not present

## 2020-08-31 DIAGNOSIS — R739 Hyperglycemia, unspecified: Secondary | ICD-10-CM

## 2020-08-31 DIAGNOSIS — I10 Essential (primary) hypertension: Secondary | ICD-10-CM

## 2020-08-31 LAB — HEPATIC FUNCTION PANEL
ALT: 42 U/L (ref 0–53)
AST: 24 U/L (ref 0–37)
Albumin: 4.7 g/dL (ref 3.5–5.2)
Alkaline Phosphatase: 82 U/L (ref 39–117)
Bilirubin, Direct: 0.2 mg/dL (ref 0.0–0.3)
Total Bilirubin: 0.8 mg/dL (ref 0.2–1.2)
Total Protein: 7.3 g/dL (ref 6.0–8.3)

## 2020-08-31 LAB — CBC WITH DIFFERENTIAL/PLATELET
Basophils Absolute: 0.1 10*3/uL (ref 0.0–0.1)
Basophils Relative: 0.7 % (ref 0.0–3.0)
Eosinophils Absolute: 0.5 10*3/uL (ref 0.0–0.7)
Eosinophils Relative: 4.9 % (ref 0.0–5.0)
HCT: 46 % (ref 39.0–52.0)
Hemoglobin: 15.4 g/dL (ref 13.0–17.0)
Lymphocytes Relative: 24.4 % (ref 12.0–46.0)
Lymphs Abs: 2.3 10*3/uL (ref 0.7–4.0)
MCHC: 33.4 g/dL (ref 30.0–36.0)
MCV: 87.9 fl (ref 78.0–100.0)
Monocytes Absolute: 0.8 10*3/uL (ref 0.1–1.0)
Monocytes Relative: 8.8 % (ref 3.0–12.0)
Neutro Abs: 5.8 10*3/uL (ref 1.4–7.7)
Neutrophils Relative %: 61.2 % (ref 43.0–77.0)
Platelets: 353 10*3/uL (ref 150.0–400.0)
RBC: 5.24 Mil/uL (ref 4.22–5.81)
RDW: 13.1 % (ref 11.5–15.5)
WBC: 9.5 10*3/uL (ref 4.0–10.5)

## 2020-08-31 LAB — LIPID PANEL
Cholesterol: 169 mg/dL (ref 0–200)
HDL: 54.9 mg/dL (ref >39.00)
LDL Cholesterol: 85 mg/dL (ref 0–99)
NonHDL: 114.14
Total CHOL/HDL Ratio: 3
Triglycerides: 147 mg/dL (ref 0.0–149.0)
VLDL: 29.4 mg/dL (ref 0.0–40.0)

## 2020-08-31 LAB — BASIC METABOLIC PANEL
BUN: 12 mg/dL (ref 6–23)
CO2: 30 mEq/L (ref 19–32)
Calcium: 9.2 mg/dL (ref 8.4–10.5)
Chloride: 100 mEq/L (ref 96–112)
Creatinine, Ser: 0.88 mg/dL (ref 0.40–1.50)
GFR: 108.48 mL/min (ref >60.00)
Glucose, Bld: 95 mg/dL (ref 70–99)
Potassium: 3.9 mEq/L (ref 3.5–5.1)
Sodium: 137 mEq/L (ref 135–145)

## 2020-08-31 LAB — HEMOGLOBIN A1C: Hgb A1c MFr Bld: 5.7 % (ref 4.6–6.5)

## 2020-08-31 LAB — TSH: TSH: 2.28 u[IU]/mL (ref 0.35–4.50)

## 2020-11-28 ENCOUNTER — Encounter: Payer: Self-pay | Admitting: Internal Medicine

## 2020-11-29 NOTE — Telephone Encounter (Signed)
Spoken to patient he has had diarrhea for two days and abdominal px left lower to belly button. Has hx of symptoms.Sitting,cough, and passing gas. seems to make sx worse. Patient has been having a lot of gas as well. Standing and laying down seems to help. NO black tarry stools, fever, chills, nausea, and vomiting. Patient has not heard anything back from GI. I instructed patient to call GI again and if unable to reach them to go to UC to be evaluated due to SX. [id number] test negative.

## 2020-11-29 NOTE — Telephone Encounter (Signed)
Given abdominal pain, low grade fever and diarrhea, I agree with evaluation today - to be able to help determine etiology and treat.

## 2020-11-30 ENCOUNTER — Encounter: Payer: BC Managed Care – PPO | Admitting: Internal Medicine

## 2020-12-07 ENCOUNTER — Other Ambulatory Visit: Payer: Self-pay | Admitting: Internal Medicine

## 2020-12-21 ENCOUNTER — Other Ambulatory Visit: Payer: Self-pay

## 2020-12-21 ENCOUNTER — Encounter: Payer: Self-pay | Admitting: Internal Medicine

## 2020-12-21 ENCOUNTER — Ambulatory Visit (INDEPENDENT_AMBULATORY_CARE_PROVIDER_SITE_OTHER): Payer: BC Managed Care – PPO | Admitting: Internal Medicine

## 2020-12-21 VITALS — BP 114/76 | HR 92 | Temp 98.1°F | Resp 16 | Ht 71.0 in | Wt 284.0 lb

## 2020-12-21 DIAGNOSIS — R739 Hyperglycemia, unspecified: Secondary | ICD-10-CM

## 2020-12-21 DIAGNOSIS — R109 Unspecified abdominal pain: Secondary | ICD-10-CM

## 2020-12-21 DIAGNOSIS — E7800 Pure hypercholesterolemia, unspecified: Secondary | ICD-10-CM | POA: Diagnosis not present

## 2020-12-21 DIAGNOSIS — K219 Gastro-esophageal reflux disease without esophagitis: Secondary | ICD-10-CM

## 2020-12-21 DIAGNOSIS — G4730 Sleep apnea, unspecified: Secondary | ICD-10-CM

## 2020-12-21 DIAGNOSIS — I10 Essential (primary) hypertension: Secondary | ICD-10-CM

## 2020-12-21 LAB — LIPID PANEL
Cholesterol: 158 mg/dL (ref 0–200)
HDL: 49.7 mg/dL (ref >39.00)
LDL Cholesterol: 81 mg/dL (ref 0–99)
NonHDL: 108.42
Total CHOL/HDL Ratio: 3
Triglycerides: 135 mg/dL (ref 0.0–149.0)
VLDL: 27 mg/dL (ref 0.0–40.0)

## 2020-12-21 LAB — HEPATIC FUNCTION PANEL
ALT: 48 U/L (ref 0–53)
AST: 26 U/L (ref 0–37)
Albumin: 4.9 g/dL (ref 3.5–5.2)
Alkaline Phosphatase: 98 U/L (ref 39–117)
Bilirubin, Direct: 0.2 mg/dL (ref 0.0–0.3)
Total Bilirubin: 1 mg/dL (ref 0.2–1.2)
Total Protein: 7.7 g/dL (ref 6.0–8.3)

## 2020-12-21 LAB — BASIC METABOLIC PANEL
BUN: 9 mg/dL (ref 6–23)
CO2: 31 mEq/L (ref 19–32)
Calcium: 10 mg/dL (ref 8.4–10.5)
Chloride: 99 mEq/L (ref 96–112)
Creatinine, Ser: 0.91 mg/dL (ref 0.40–1.50)
GFR: 106.1 mL/min (ref >60.00)
Glucose, Bld: 98 mg/dL (ref 70–99)
Potassium: 5.2 mEq/L — ABNORMAL HIGH (ref 3.5–5.1)
Sodium: 138 mEq/L (ref 135–145)

## 2020-12-21 LAB — CBC WITH DIFFERENTIAL/PLATELET
Basophils Absolute: 0.1 10*3/uL (ref 0.0–0.1)
Basophils Relative: 0.4 % (ref 0.0–3.0)
Eosinophils Absolute: 0.3 10*3/uL (ref 0.0–0.7)
Eosinophils Relative: 2 % (ref 0.0–5.0)
HCT: 45.7 % (ref 39.0–52.0)
Hemoglobin: 15 g/dL (ref 13.0–17.0)
Lymphocytes Relative: 11.5 % — ABNORMAL LOW (ref 12.0–46.0)
Lymphs Abs: 1.7 10*3/uL (ref 0.7–4.0)
MCHC: 32.7 g/dL (ref 30.0–36.0)
MCV: 88.8 fl (ref 78.0–100.0)
Monocytes Absolute: 1.3 10*3/uL — ABNORMAL HIGH (ref 0.1–1.0)
Monocytes Relative: 9 % (ref 3.0–12.0)
Neutro Abs: 11.1 10*3/uL — ABNORMAL HIGH (ref 1.4–7.7)
Neutrophils Relative %: 77.1 % — ABNORMAL HIGH (ref 43.0–77.0)
Platelets: 413 10*3/uL — ABNORMAL HIGH (ref 150.0–400.0)
RBC: 5.15 Mil/uL (ref 4.22–5.81)
RDW: 13.4 % (ref 11.5–15.5)
WBC: 14.3 10*3/uL — ABNORMAL HIGH (ref 4.0–10.5)

## 2020-12-21 LAB — HEMOGLOBIN A1C: Hgb A1c MFr Bld: 5.7 % (ref 4.6–6.5)

## 2020-12-21 MED ORDER — AMOXICILLIN-POT CLAVULANATE 875-125 MG PO TABS
1.0000 | ORAL_TABLET | Freq: Two times a day (BID) | ORAL | 0 refills | Status: DC
Start: 2020-12-21 — End: 2021-03-08

## 2020-12-21 NOTE — Progress Notes (Signed)
Patient ID: TAVIUS TURGEON, male   DOB: 15-Feb-1981, 40 y.o.   MRN: [id number]   Subjective:    Patient ID: Beverly Gust, male    DOB: 03/22/1981, 40 y.o.   MRN: [id number]  HPI This visit occurred during the SARS-CoV-2 public health emergency.  Safety protocols were in place, including screening questions prior to the visit, additional usage of staff PPE, and extensive cleaning of exam room while observing appropriate contact time as indicated for disinfecting solutions.  Patient here for a scheduled physical.  Given currently abdominal pain, the visit was changed to follow up visit. He was recently evaluated at Urgent Care ([facility]) in [facility] (11/28/20)   Had CT scan.  Diagnosed with diverticulitis.  Was treated with augmentin.  Has f/u with GI next week.  Took abx for 12 days.  Symptoms resolved.  States was off for two weeks.  Watching diet.  Decreased carbs.  Started taking citrucel.  Walking.  Noticed early yesterday am (woke him from sleep 3:00 am) - increased pain - LLQ.  Dull ache yesterday am.  Martin Majestic to work - worsened.  Hurt to wear a seatbelt.  Started on liquid diet yesterday.  Last bowel movement yesterday.  No blood.  No nausea or vomiting.  No fever.  Pain not as severe as yesterday, but still with increased pain.    Past Medical History:  Diagnosis Date  . Chest discomfort   . Dyspnea on exertion   . GERD (gastroesophageal reflux disease)   . Hyperlipidemia   . Hypertension   . Morbid obesity ([place])   . Overactive bladder    History reviewed. No pertinent surgical history. Family History  Problem Relation Age of Onset  . Prostate cancer Paternal Grandfather        MI @ 43.  Marland Kitchen Heart disease Paternal Grandfather   . Prostate cancer Paternal Uncle   . Lung cancer Maternal Grandmother   . Hypertension Mother        currently in her 32's  . Hypercholesterolemia Mother   . Diabetes Mother   . Hypertension Father        currently in his 72's  . Hypercholesterolemia  Father   . Heart disease Maternal Grandfather        MI in his 35's  . Colon cancer Neg Hx    Social History   Socioeconomic History  . Marital status: Single    Spouse name: Not on file  . Number of children: Not on file  . Years of education: Not on file  . Highest education level: Not on file  Occupational History  . Occupation: Energy manager: [employer]  Tobacco Use  . Smoking status: Never Smoker  . Smokeless tobacco: Current User    Types: Chew  Vaping Use  . Vaping Use: Never used  Substance and Sexual Activity  . Alcohol use: Yes    Alcohol/week: 0.0 standard drinks    Comment: three to four beers a month.  . Drug use: No  . Sexual activity: Not on file  Other Topics Concern  . Not on file  Social History Narrative   Lives in [place] with family.  Single.  No children.  Does routinely exercise - wt training, treadmill.   Social Determinants of Health   Financial Resource Strain: Not on file  Food Insecurity: Not on file  Transportation Needs: Not on file  Physical Activity: Not on file  Stress: Not  on file  Social Connections: Not on file    Outpatient Encounter Medications as of 12/21/2020  Medication Sig  . amoxicillin-clavulanate (AUGMENTIN) 875-125 MG tablet Take 1 tablet by mouth 2 (two) times daily.  Marland Kitchen atorvastatin (LIPITOR) 10 MG tablet TAKE 1 TABLET(10 MG) BY MOUTH DAILY  . Azelastine-Fluticasone 137-50 MCG/ACT SUSP 1 spray each nostril bid  . LINZESS 145 MCG CAPS capsule Take 145 mcg by mouth once.  Marland Kitchen lisinopril (ZESTRIL) 10 MG tablet TAKE 1 TABLET(10 MG) BY MOUTH DAILY  . Multiple Vitamin (MULTIVITAMIN) tablet Take 1 tablet by mouth daily.  . [DISCONTINUED] LINZESS 290 MCG CAPS capsule Take 290 mcg by mouth daily.   No facility-administered encounter medications on file as of 12/21/2020.    Review of Systems  Constitutional: Negative for appetite change and unexpected weight change.  HENT: Negative for  congestion and sinus pressure.   Respiratory: Negative for cough, chest tightness and shortness of breath.   Cardiovascular: Negative for chest pain, palpitations and leg swelling.  Gastrointestinal: Positive for abdominal pain. Negative for diarrhea, nausea and vomiting.  Genitourinary: Negative for difficulty urinating and dysuria.  Musculoskeletal: Negative for joint swelling and myalgias.  Skin: Negative for color change and rash.  Neurological: Negative for dizziness, light-headedness and headaches.  Psychiatric/Behavioral: Negative for agitation and dysphoric mood.       Objective:    Physical Exam Vitals reviewed.  Constitutional:      General: He is not in acute distress.    Appearance: Normal appearance. He is well-developed.  HENT:     Head: Normocephalic and atraumatic.     Right Ear: External ear normal.     Left Ear: External ear normal.  Eyes:     General: No scleral icterus.       Right eye: No discharge.        Left eye: No discharge.     Conjunctiva/sclera: Conjunctivae normal.  Cardiovascular:     Rate and Rhythm: Normal rate and regular rhythm.  Pulmonary:     Effort: Pulmonary effort is normal. No respiratory distress.     Breath sounds: Normal breath sounds.  Abdominal:     General: Bowel sounds are normal.     Palpations: Abdomen is soft.     Comments: Increased pain - LLQ.  No rebound.    Musculoskeletal:        General: No swelling or tenderness.     Cervical back: Neck supple. No tenderness.  Lymphadenopathy:     Cervical: No cervical adenopathy.  Skin:    Findings: No erythema or rash.  Neurological:     Mental Status: He is alert.  Psychiatric:        Mood and Affect: Mood normal.        Behavior: Behavior normal.     BP 114/76   Pulse 92   Temp 98.1 F (36.7 C) (Oral)   Resp 16   Ht 5' 11" (1.803 m)   Wt 284 lb (128.8 kg)   SpO2 98%   BMI 39.61 kg/m  Wt Readings from Last 3 Encounters:  12/21/20 284 lb (128.8 kg)  07/27/20  286 lb (129.7 kg)  03/23/20 (!) 285 lb (129.3 kg)     Lab Results  Component Value Date   WBC 14.3 (H) 12/21/2020   HGB 15.0 12/21/2020   HCT 45.7 12/21/2020   PLT 413.0 (H) 12/21/2020   GLUCOSE 98 12/21/2020   CHOL 158 12/21/2020   TRIG 135.0 12/21/2020   HDL 49.70  12/21/2020   LDLDIRECT 159.0 11/06/2014   LDLCALC 81 12/21/2020   ALT 48 12/21/2020   AST 26 12/21/2020   NA 138 12/21/2020   K 5.2 (H) 12/21/2020   CL 99 12/21/2020   CREATININE 0.91 12/21/2020   BUN 9 12/21/2020   CO2 31 12/21/2020   TSH 2.28 08/31/2020   HGBA1C 5.7 12/21/2020    CT Abdomen Pelvis W Contrast  Result Date: 12/06/2019 CLINICAL DATA:  Mid abdominal pain with constipation for 6 months EXAM: CT ABDOMEN AND PELVIS WITH CONTRAST TECHNIQUE: Multidetector CT imaging of the abdomen and pelvis was performed using the standard protocol following bolus administration of intravenous contrast. CONTRAST:  100mL OMNIPAQUE IOHEXOL 300 MG/ML  SOLN COMPARISON:  Abdominal ultrasound 02/04/2019 FINDINGS: Lower chest: No acute abnormality. Hepatobiliary: No focal liver abnormality is seen. No gallstones, gallbladder wall thickening, or biliary dilatation. Pancreas: Unremarkable. No pancreatic ductal dilatation or surrounding inflammatory changes. Spleen: Normal in size without focal abnormality. Adrenals/Urinary Tract: Adrenal glands are unremarkable. Kidneys are normal, without renal calculi, focal lesion, or hydronephrosis. Bladder is decompressed. Stomach/Bowel: Stomach and bowel are well distended with oral contrast. Stomach is within normal limits. Appendix appears normal. Long segment mild circumferential wall thickening of the descending and sigmoid colon (series 4, image 77). There are a few scattered diverticula. No short segment bowel wall thickening. No dilated loops of bowel. No pericolonic inflammatory changes. Mild-moderate volume of stool throughout the colon. Vascular/Lymphatic: No significant vascular findings  are present. No enlarged abdominal or pelvic lymph nodes. Reproductive: Prostate is unremarkable. Other: No abdominal wall hernia or abnormality. No abdominopelvic ascites. Musculoskeletal: Transitional lumbosacral anatomy with partial lumbarization of the S1 segment. No acute osseous findings. IMPRESSION: 1. Long segment mild circumferential wall thickening of the descending and sigmoid colon, which may represent a mild infectious or inflammatory colitis. 2. Mild-moderate volume of stool throughout the colon. Electronically Signed   By: Nicholas  Plundo D.O.   On: 12/06/2019 13:46       Assessment & Plan:   Problem List Items Addressed This Visit    Abdominal pain    Has seen GI. Has been on linzess.  Recent abdominal pain as outlined.  CT - per his report - diverticulitis.  Treated.  Symptoms resolved.  Need records.  Recent increased LLQ pain as outlined.  Feels similar to previous diverticulitis.  Exam as outlined.  Hold on CT today.  Will treat with augmentin as outlined.  Probiotics as directed.  Follow symptoms closely. Any change or worsening symptoms, he is to be evaluated.  Keep f/u with GI next week to discuss further treatment.        Relevant Orders   CBC with Differential/Platelet (Completed)   GERD (gastroesophageal reflux disease)    Continue protonix.  No acid reflux reported.       Relevant Medications   LINZESS 145 MCG CAPS capsule   Hypercholesterolemia    Continue lipitor.  Low cholesterol diet and exercise.  Check lipid panel and liver function today.       Relevant Orders   Hepatic function panel (Completed)   Lipid panel (Completed)   Hyperglycemia    Has adjusted his diet.  Decreased carb intake.  Check met b and a1c.  Continue walking.       Relevant Orders   Hemoglobin A1c (Completed)   Hypertension, essential - Primary    Continue lisinopril.  Blood pressure doing well. Follow pressures.  Follow metabolic panel.       Relevant Orders     Basic metabolic  panel (Completed)   Sleep apnea    Continue cpap.           Charlene Scott, MD 

## 2020-12-22 ENCOUNTER — Other Ambulatory Visit: Payer: Self-pay | Admitting: Internal Medicine

## 2020-12-22 DIAGNOSIS — E875 Hyperkalemia: Secondary | ICD-10-CM

## 2020-12-22 DIAGNOSIS — D72829 Elevated white blood cell count, unspecified: Secondary | ICD-10-CM

## 2020-12-22 NOTE — Progress Notes (Signed)
Order placed for f/u labs.  

## 2020-12-23 ENCOUNTER — Telehealth: Payer: Self-pay | Admitting: Internal Medicine

## 2020-12-23 ENCOUNTER — Encounter: Payer: Self-pay | Admitting: Internal Medicine

## 2020-12-23 NOTE — Assessment & Plan Note (Signed)
Continue lipitor.  Low cholesterol diet and exercise.  Check lipid panel and liver function today.

## 2020-12-23 NOTE — Assessment & Plan Note (Signed)
Has seen GI. Has been on linzess.  Recent abdominal pain as outlined.  CT - per his report - diverticulitis.  Treated.  Symptoms resolved.  Need records.  Recent increased LLQ pain as outlined.  Feels similar to previous diverticulitis.  Exam as outlined.  Hold on CT today.  Will treat with augmentin as outlined.  Probiotics as directed.  Follow symptoms closely. Any change or worsening symptoms, he is to be evaluated.  Keep f/u with GI next week to discuss further treatment.

## 2020-12-23 NOTE — Assessment & Plan Note (Signed)
Has adjusted his diet.  Decreased carb intake.  Check met b and a1c.  Continue walking.

## 2020-12-23 NOTE — Telephone Encounter (Signed)
My chart message sent to [facility] for update.  Need records from [facility] - including labs and CT scan.  Thanks

## 2020-12-23 NOTE — Assessment & Plan Note (Signed)
Continue cpap.  

## 2020-12-23 NOTE — Assessment & Plan Note (Signed)
Continue lisinopril.  Blood pressure doing well.  Follow pressures.  Follow metabolic panel.  

## 2020-12-23 NOTE — Assessment & Plan Note (Signed)
Continue protonix.  No acid reflux reported.  

## 2020-12-24 NOTE — Telephone Encounter (Signed)
Records requested from [facility]

## 2020-12-28 ENCOUNTER — Other Ambulatory Visit (INDEPENDENT_AMBULATORY_CARE_PROVIDER_SITE_OTHER): Payer: BC Managed Care – PPO

## 2020-12-28 ENCOUNTER — Other Ambulatory Visit: Payer: Self-pay

## 2020-12-28 DIAGNOSIS — E875 Hyperkalemia: Secondary | ICD-10-CM

## 2020-12-28 DIAGNOSIS — D72829 Elevated white blood cell count, unspecified: Secondary | ICD-10-CM | POA: Diagnosis not present

## 2020-12-28 LAB — CBC AND DIFFERENTIAL
Neutrophils Absolute: 6901
WBC: 10.3

## 2020-12-28 LAB — BASIC METABOLIC PANEL: Potassium: 4.2 (ref 3.4–5.3)

## 2020-12-28 NOTE — Addendum Note (Signed)
Addended by: Clearnce Sorrel on: 12/28/2020 11:47 AM   Modules accepted: Orders

## 2020-12-29 LAB — CBC WITH DIFFERENTIAL/PLATELET
Absolute Monocytes: 845 cells/uL (ref 200–950)
Basophils Absolute: 72 cells/uL (ref 0–200)
Basophils Relative: 0.7 %
Eosinophils Absolute: 422 cells/uL (ref 15–500)
Eosinophils Relative: 4.1 %
HCT: 45 % (ref 38.5–50.0)
Hemoglobin: 14.8 g/dL (ref 13.2–17.1)
Lymphs Abs: 2060 cells/uL (ref 850–3900)
MCH: 29 pg (ref 27.0–33.0)
MCHC: 32.9 g/dL (ref 32.0–36.0)
MCV: 88.2 fL (ref 80.0–100.0)
MPV: 10.1 fL (ref 7.5–12.5)
Monocytes Relative: 8.2 %
Neutro Abs: 6901 cells/uL (ref 1500–7800)
Neutrophils Relative %: 67 %
Platelets: 452 10*3/uL — ABNORMAL HIGH (ref 140–400)
RBC: 5.1 10*6/uL (ref 4.20–5.80)
RDW: 12.1 % (ref 11.0–15.0)
Total Lymphocyte: 20 %
WBC: 10.3 10*3/uL (ref 3.8–10.8)

## 2020-12-29 LAB — POTASSIUM: Potassium: 4.2 mmol/L (ref 3.5–5.3)

## 2021-01-18 ENCOUNTER — Other Ambulatory Visit: Payer: Self-pay

## 2021-03-08 ENCOUNTER — Ambulatory Visit (INDEPENDENT_AMBULATORY_CARE_PROVIDER_SITE_OTHER): Payer: BC Managed Care – PPO | Admitting: Internal Medicine

## 2021-03-08 ENCOUNTER — Encounter: Payer: Self-pay | Admitting: Internal Medicine

## 2021-03-08 ENCOUNTER — Other Ambulatory Visit: Payer: Self-pay

## 2021-03-08 VITALS — BP 126/80 | HR 88 | Temp 98.4°F | Ht 70.98 in | Wt 288.6 lb

## 2021-03-08 DIAGNOSIS — I10 Essential (primary) hypertension: Secondary | ICD-10-CM

## 2021-03-08 DIAGNOSIS — K219 Gastro-esophageal reflux disease without esophagitis: Secondary | ICD-10-CM

## 2021-03-08 DIAGNOSIS — D72829 Elevated white blood cell count, unspecified: Secondary | ICD-10-CM

## 2021-03-08 DIAGNOSIS — Z0000 Encounter for general adult medical examination without abnormal findings: Secondary | ICD-10-CM | POA: Diagnosis not present

## 2021-03-08 DIAGNOSIS — G4730 Sleep apnea, unspecified: Secondary | ICD-10-CM | POA: Diagnosis not present

## 2021-03-08 DIAGNOSIS — D75839 Thrombocytosis, unspecified: Secondary | ICD-10-CM

## 2021-03-08 DIAGNOSIS — R739 Hyperglycemia, unspecified: Secondary | ICD-10-CM

## 2021-03-08 DIAGNOSIS — R109 Unspecified abdominal pain: Secondary | ICD-10-CM

## 2021-03-08 DIAGNOSIS — E7800 Pure hypercholesterolemia, unspecified: Secondary | ICD-10-CM

## 2021-03-08 NOTE — Progress Notes (Signed)
Patient ID: Gary Fleming, male   DOB: 01-Apr-1981, 40 y.o.   MRN: [id number]   Subjective:    Patient ID: Gary Fleming, male    DOB: [birth year], 40 y.o.   MRN: [id number]  HPI This visit occurred during the SARS-CoV-2 public health emergency.  Safety protocols were in place, including screening questions prior to the visit, additional usage of staff PPE, and extensive cleaning of exam room while observing appropriate contact time as indicated for disinfecting solutions.   Patient here for his physical exam.  He is doing better.  Saw GI.  S/p abx treatment for diverticulitis.  Symptoms resolved with last round of abx.  Taking linzess now.  Keeping bowels moving.  No abdominal pain.  Trying to stay active.  No chest pain or sob reported.  No cough or congestion.  Overall doing well.  Feels better.   Past Medical History:  Diagnosis Date   Chest discomfort    Dyspnea on exertion    GERD (gastroesophageal reflux disease)    Hyperlipidemia    Hypertension    Morbid obesity ([place])    Overactive bladder    History reviewed. No pertinent surgical history. Family History  Problem Relation Age of Onset   Prostate cancer Paternal Grandfather        MI @ 44.   Heart disease Paternal Grandfather    Prostate cancer Paternal Uncle    Lung cancer Maternal Grandmother    Hypertension Mother        currently in her 41's   Hypercholesterolemia Mother    Diabetes Mother    Hypertension Father        currently in his 31's   Hypercholesterolemia Father    Heart disease Maternal Grandfather        MI in his 11's   Colon cancer Neg Hx    Social History   Socioeconomic History   Marital status: Single    Spouse name: Not on file   Number of children: Not on file   Years of education: Not on file   Highest education level: Not on file  Occupational History   Occupation: Energy manager: [employer]  Tobacco Use   Smoking status: Never   Smokeless tobacco:  Current    Types: Chew  Vaping Use   Vaping Use: Never used  Substance and Sexual Activity   Alcohol use: Yes    Alcohol/week: 0.0 standard drinks    Comment: three to four beers a month.   Drug use: No   Sexual activity: Not on file  Other Topics Concern   Not on file  Social History Narrative   Lives in [place] with family.  Single.  No children.  Does routinely exercise - wt training, treadmill.   Social Determinants of Health   Financial Resource Strain: Not on file  Food Insecurity: Not on file  Transportation Needs: Not on file  Physical Activity: Not on file  Stress: Not on file  Social Connections: Not on file    Review of Systems  Constitutional:  Negative for appetite change and unexpected weight change.  HENT:  Negative for congestion, sinus pressure and sore throat.   Eyes:  Negative for pain and visual disturbance.  Respiratory:  Negative for cough, chest tightness and shortness of breath.   Cardiovascular:  Negative for chest pain, palpitations and leg swelling.  Gastrointestinal:  Negative for abdominal pain, diarrhea, nausea and vomiting.  Genitourinary:  Negative for  difficulty urinating and dysuria.  Musculoskeletal:  Negative for joint swelling and myalgias.  Skin:  Negative for color change and rash.  Neurological:  Negative for dizziness, light-headedness and headaches.  Hematological:  Negative for adenopathy. Does not bruise/bleed easily.  Psychiatric/Behavioral:  Negative for decreased concentration and dysphoric mood.       Objective:    Physical Exam Constitutional:      General: He is not in acute distress.    Appearance: Normal appearance. He is well-developed.  HENT:     Head: Normocephalic and atraumatic.     Right Ear: External ear normal.     Left Ear: External ear normal.  Eyes:     General: No scleral icterus.       Right eye: No discharge.        Left eye: No discharge.     Conjunctiva/sclera: Conjunctivae normal.  Neck:      Thyroid: No thyromegaly.  Cardiovascular:     Rate and Rhythm: Normal rate and regular rhythm.  Pulmonary:     Effort: No respiratory distress.     Breath sounds: Normal breath sounds. No wheezing.  Abdominal:     General: Bowel sounds are normal.     Palpations: Abdomen is soft.     Tenderness: There is no abdominal tenderness.  Musculoskeletal:        General: No swelling or tenderness.     Cervical back: Neck supple. No tenderness.  Lymphadenopathy:     Cervical: No cervical adenopathy.  Skin:    Findings: No erythema or rash.  Neurological:     Mental Status: He is alert and oriented to person, place, and time.  Psychiatric:        Mood and Affect: Mood normal.        Behavior: Behavior normal.    BP 126/80   Pulse 88   Temp 98.4 F (36.9 C)   Ht 5' 10.98" (1.803 m)   Wt 288 lb 9.6 oz (130.9 kg)   SpO2 97%   BMI 40.27 kg/m  Wt Readings from Last 3 Encounters:  03/08/21 288 lb 9.6 oz (130.9 kg)  12/21/20 284 lb (128.8 kg)  07/27/20 286 lb (129.7 kg)    Outpatient Encounter Medications as of 03/08/2021  Medication Sig   atorvastatin (LIPITOR) 10 MG tablet TAKE 1 TABLET(10 MG) BY MOUTH DAILY   Azelastine-Fluticasone 137-50 MCG/ACT SUSP 1 spray each nostril bid   LINZESS 145 MCG CAPS capsule Take 145 mcg by mouth once.   lisinopril (ZESTRIL) 10 MG tablet TAKE 1 TABLET(10 MG) BY MOUTH DAILY   Multiple Vitamin (MULTIVITAMIN) tablet Take 1 tablet by mouth daily.   [DISCONTINUED] amoxicillin-clavulanate (AUGMENTIN) 875-125 MG tablet Take 1 tablet by mouth 2 (two) times daily.   No facility-administered encounter medications on file as of 03/08/2021.     Lab Results  Component Value Date   WBC 10.3 12/28/2020   HGB 14.8 12/28/2020   HCT 45.0 12/28/2020   PLT 452 (H) 12/28/2020   GLUCOSE 98 12/21/2020   CHOL 158 12/21/2020   TRIG 135.0 12/21/2020   HDL 49.70 12/21/2020   LDLDIRECT 159.0 11/06/2014   LDLCALC 81 12/21/2020   ALT 48 12/21/2020   AST 26 12/21/2020    NA 138 12/21/2020   K 4.2 12/28/2020   CL 99 12/21/2020   CREATININE 0.91 12/21/2020   BUN 9 12/21/2020   CO2 31 12/21/2020   TSH 2.28 08/31/2020   HGBA1C 5.7 12/21/2020    CT Abdomen  Pelvis W Contrast  Result Date: 12/06/2019 CLINICAL DATA:  Mid abdominal pain with constipation for 6 months EXAM: CT ABDOMEN AND PELVIS WITH CONTRAST TECHNIQUE: Multidetector CT imaging of the abdomen and pelvis was performed using the standard protocol following bolus administration of intravenous contrast. CONTRAST:  121m OMNIPAQUE IOHEXOL 300 MG/ML  SOLN COMPARISON:  Abdominal ultrasound 02/04/2019 FINDINGS: Lower chest: No acute abnormality. Hepatobiliary: No focal liver abnormality is seen. No gallstones, gallbladder wall thickening, or biliary dilatation. Pancreas: Unremarkable. No pancreatic ductal dilatation or surrounding inflammatory changes. Spleen: Normal in size without focal abnormality. Adrenals/Urinary Tract: Adrenal glands are unremarkable. Kidneys are normal, without renal calculi, focal lesion, or hydronephrosis. Bladder is decompressed. Stomach/Bowel: Stomach and bowel are well distended with oral contrast. Stomach is within normal limits. Appendix appears normal. Long segment mild circumferential wall thickening of the descending and sigmoid colon (series 4, image 77). There are a few scattered diverticula. No short segment bowel wall thickening. No dilated loops of bowel. No pericolonic inflammatory changes. Mild-moderate volume of stool throughout the colon. Vascular/Lymphatic: No significant vascular findings are present. No enlarged abdominal or pelvic lymph nodes. Reproductive: Prostate is unremarkable. Other: No abdominal wall hernia or abnormality. No abdominopelvic ascites. Musculoskeletal: Transitional lumbosacral anatomy with partial lumbarization of the S1 segment. No acute osseous findings. IMPRESSION: 1. Long segment mild circumferential wall thickening of the descending and sigmoid  colon, which may represent a mild infectious or inflammatory colitis. 2. Mild-moderate volume of stool throughout the colon. Electronically Signed   By: NDavina PokeD.O.   On: 12/06/2019 13:46       Assessment & Plan:   Problem List Items Addressed This Visit     Abdominal pain    Abdominal pain resolved.  No pain now.  Bowels better.  On linzess.  Follow.         GERD (gastroesophageal reflux disease)    No acid reflux reported.  Continue protonix.         Health care maintenance    Physical today 03/08/21.  Check cholesterol.        Hypercholesterolemia    Continue lipitor.  Low cholesterol diet and exercise.  Follow lipid panel and liver function tests.         Relevant Orders   Hepatic function panel   Lipid panel   Hyperglycemia    Low carb diet and exercise.  Follow met b and a1c.        Relevant Orders   Hemoglobin A1c   Hypertension, essential    Continue lisinopril.  Blood pressure doing well. Follow pressures.  Follow metabolic panel.        Relevant Orders   Basic metabolic panel   Leukocytosis    Last wbc count wnl - 12/2020.         Sleep apnea    Continue cpap.        Thrombocytosis    Platelet count elevated on last check.  Recheck cbc with next labs.        Relevant Orders   CBC with Differential/Platelet   Other Visit Diagnoses     Routine general medical examination at a health care facility    -  Primary        CEinar Pheasant MD

## 2021-03-10 ENCOUNTER — Encounter: Payer: Self-pay | Admitting: Internal Medicine

## 2021-03-10 DIAGNOSIS — D75839 Thrombocytosis, unspecified: Secondary | ICD-10-CM | POA: Insufficient documentation

## 2021-03-10 NOTE — Assessment & Plan Note (Signed)
Continue lisinopril.  Blood pressure doing well.  Follow pressures.  Follow metabolic panel.  

## 2021-03-10 NOTE — Assessment & Plan Note (Signed)
Last wbc count wnl - 12/2020.

## 2021-03-10 NOTE — Assessment & Plan Note (Signed)
Continue cpap.  

## 2021-03-10 NOTE — Assessment & Plan Note (Signed)
Abdominal pain resolved.  No pain now.  Bowels better.  On linzess.  Follow.

## 2021-03-10 NOTE — Assessment & Plan Note (Signed)
Continue lipitor.  Low cholesterol diet and exercise.  Follow lipid panel and liver function tests.   

## 2021-03-10 NOTE — Assessment & Plan Note (Signed)
Low carb diet and exercise.  Follow met b and a1c.  

## 2021-03-10 NOTE — Assessment & Plan Note (Signed)
Platelet count elevated on last check.  Recheck cbc with next labs.

## 2021-03-10 NOTE — Assessment & Plan Note (Signed)
Physical today 03/08/21.  Check cholesterol.

## 2021-03-10 NOTE — Assessment & Plan Note (Signed)
No acid reflux reported.  Continue protonix.  

## 2021-03-12 ENCOUNTER — Other Ambulatory Visit: Payer: Self-pay | Admitting: Internal Medicine

## 2021-03-12 MED ORDER — LEVOCETIRIZINE DIHYDROCHLORIDE 5 MG PO TABS: 5.0000 mg | ORAL_TABLET | Freq: Every day | ORAL | 1 refills | Status: DC

## 2021-03-12 NOTE — Telephone Encounter (Signed)
Rx sent in for xyzal.

## 2021-04-02 ENCOUNTER — Encounter: Payer: Self-pay | Admitting: Internal Medicine

## 2021-04-02 DIAGNOSIS — Z9109 Other allergy status, other than to drugs and biological substances: Secondary | ICD-10-CM

## 2021-04-04 NOTE — Telephone Encounter (Signed)
Given persistent allergies and symptoms despite multiple medications, I would recommend ENT (or allergist) referral.  If agreeable, let me know and I can place order for referral.

## 2021-04-04 NOTE — Telephone Encounter (Signed)
Called patient for more info. Patient says that nothing acute is going on, he was sending message to update Dr Lorin Picket on change in medication for his allergies. the xyzal did not help him at all. He is now using mucinex and nasacort every day and also doing the saline nasal spray. Regarding the flushing feeling, he says this happens about 2-3 times a week in the afternoon and goes away after a couple hours. Patient denies headaches, blurred vision, dizziness, etc. He says that he feels fine but just wanted to know if the mucinex and nasacort could be contributing to the BP and if there is something else to try for persistent allergies. He has rechecked his bp when he feels flushed and it is running about 140s/80s-90. Patient is also aware that PCP is out of office and says that he is ok to wait for response. Advised that if his BP continues to increase or he develops any new acute symptoms he needs to be evaluated acutely. Patient gave verbal understanding

## 2021-04-05 NOTE — Telephone Encounter (Signed)
Patient is agreeable to ENT referral but lives in [place]. Patient would like to see if he can find an office closer to where he lives. He will send a message with name of office/MD he wishes to see. If unable to find one, he is agreeable to see someone in [place]

## 2021-04-12 NOTE — Telephone Encounter (Signed)
Order placed for ENT referral.   

## 2021-04-29 ENCOUNTER — Encounter: Payer: Self-pay | Admitting: Internal Medicine

## 2021-06-07 ENCOUNTER — Other Ambulatory Visit (INDEPENDENT_AMBULATORY_CARE_PROVIDER_SITE_OTHER): Payer: BC Managed Care – PPO

## 2021-06-07 ENCOUNTER — Other Ambulatory Visit: Payer: Self-pay

## 2021-06-07 DIAGNOSIS — D75839 Thrombocytosis, unspecified: Secondary | ICD-10-CM

## 2021-06-07 DIAGNOSIS — E7800 Pure hypercholesterolemia, unspecified: Secondary | ICD-10-CM | POA: Diagnosis not present

## 2021-06-07 DIAGNOSIS — I10 Essential (primary) hypertension: Secondary | ICD-10-CM

## 2021-06-07 DIAGNOSIS — R739 Hyperglycemia, unspecified: Secondary | ICD-10-CM

## 2021-06-07 LAB — CBC WITH DIFFERENTIAL/PLATELET
Basophils Absolute: 0 10*3/uL (ref 0.0–0.1)
Basophils Relative: 0.4 % (ref 0.0–3.0)
Eosinophils Absolute: 0.5 10*3/uL (ref 0.0–0.7)
Eosinophils Relative: 5.4 % — ABNORMAL HIGH (ref 0.0–5.0)
HCT: 46.5 % (ref 39.0–52.0)
Hemoglobin: 15.5 g/dL (ref 13.0–17.0)
Lymphocytes Relative: 24 % (ref 12.0–46.0)
Lymphs Abs: 2.1 10*3/uL (ref 0.7–4.0)
MCHC: 33.4 g/dL (ref 30.0–36.0)
MCV: 87.9 fl (ref 78.0–100.0)
Monocytes Absolute: 0.9 10*3/uL (ref 0.1–1.0)
Monocytes Relative: 9.7 % (ref 3.0–12.0)
Neutro Abs: 5.4 10*3/uL (ref 1.4–7.7)
Neutrophils Relative %: 60.5 % (ref 43.0–77.0)
Platelets: 364 10*3/uL (ref 150.0–400.0)
RBC: 5.29 Mil/uL (ref 4.22–5.81)
RDW: 13.3 % (ref 11.5–15.5)
WBC: 8.9 10*3/uL (ref 4.0–10.5)

## 2021-06-07 LAB — HEPATIC FUNCTION PANEL
ALT: 44 U/L (ref 0–53)
AST: 25 U/L (ref 0–37)
Albumin: 4.6 g/dL (ref 3.5–5.2)
Alkaline Phosphatase: 97 U/L (ref 39–117)
Bilirubin, Direct: 0.2 mg/dL (ref 0.0–0.3)
Total Bilirubin: 0.7 mg/dL (ref 0.2–1.2)
Total Protein: 7.3 g/dL (ref 6.0–8.3)

## 2021-06-07 LAB — BASIC METABOLIC PANEL
BUN: 12 mg/dL (ref 6–23)
CO2: 30 mEq/L (ref 19–32)
Calcium: 9.5 mg/dL (ref 8.4–10.5)
Chloride: 98 mEq/L (ref 96–112)
Creatinine, Ser: 0.88 mg/dL (ref 0.40–1.50)
GFR: 107.9 mL/min (ref >60.00)
Glucose, Bld: 95 mg/dL (ref 70–99)
Potassium: 4.2 mEq/L (ref 3.5–5.1)
Sodium: 136 mEq/L (ref 135–145)

## 2021-06-07 LAB — LIPID PANEL
Cholesterol: 164 mg/dL (ref 0–200)
HDL: 56.1 mg/dL (ref >39.00)
LDL Cholesterol: 79 mg/dL (ref 0–99)
NonHDL: 107.54
Total CHOL/HDL Ratio: 3
Triglycerides: 141 mg/dL (ref 0.0–149.0)
VLDL: 28.2 mg/dL (ref 0.0–40.0)

## 2021-06-07 LAB — HEMOGLOBIN A1C: Hgb A1c MFr Bld: 5.8 % (ref 4.6–6.5)

## 2021-07-12 ENCOUNTER — Ambulatory Visit: Payer: BC Managed Care – PPO | Admitting: Internal Medicine

## 2021-07-12 ENCOUNTER — Other Ambulatory Visit: Payer: Self-pay

## 2021-07-12 VITALS — BP 136/82 | HR 100 | Temp 97.6°F | Resp 16 | Ht 71.0 in | Wt 294.0 lb

## 2021-07-12 DIAGNOSIS — G4730 Sleep apnea, unspecified: Secondary | ICD-10-CM

## 2021-07-12 DIAGNOSIS — Z1159 Encounter for screening for other viral diseases: Secondary | ICD-10-CM

## 2021-07-12 DIAGNOSIS — K219 Gastro-esophageal reflux disease without esophagitis: Secondary | ICD-10-CM

## 2021-07-12 DIAGNOSIS — R739 Hyperglycemia, unspecified: Secondary | ICD-10-CM

## 2021-07-12 DIAGNOSIS — R0981 Nasal congestion: Secondary | ICD-10-CM

## 2021-07-12 DIAGNOSIS — D75839 Thrombocytosis, unspecified: Secondary | ICD-10-CM

## 2021-07-12 DIAGNOSIS — E7800 Pure hypercholesterolemia, unspecified: Secondary | ICD-10-CM | POA: Diagnosis not present

## 2021-07-12 DIAGNOSIS — I10 Essential (primary) hypertension: Secondary | ICD-10-CM | POA: Diagnosis not present

## 2021-07-12 DIAGNOSIS — Z23 Encounter for immunization: Secondary | ICD-10-CM

## 2021-07-12 MED ORDER — LISINOPRIL 10 MG PO TABS: 10.0000 mg | ORAL_TABLET | Freq: Every day | ORAL | 3 refills | Status: DC

## 2021-07-12 MED ORDER — ATORVASTATIN CALCIUM 10 MG PO TABS: 10.0000 mg | ORAL_TABLET | Freq: Every day | ORAL | 3 refills | Status: DC

## 2021-07-12 NOTE — Progress Notes (Signed)
Patient ID: EGBERT SEIDEL, male   DOB: 03/28/1981, 40 y.o.   MRN: [id number]   Subjective:    Patient ID: Beverly Gust, male    DOB: 1981-02-08, 40 y.o.   MRN: [id number]  This visit occurred during the SARS-CoV-2 public health emergency.  Safety protocols were in place, including screening questions prior to the visit, additional usage of staff PPE, and extensive cleaning of exam room while observing appropriate contact time as indicated for disinfecting solutions.   Patient here for a scheduled follow up.   Chief Complaint  Patient presents with   Hyperlipidemia   Hypertension   .   HPI Has had issues with persistent "allergies" - nasal congestion.  Has tried xyzal.  Also - steroid nasal spray.  Taking claritin D.  Using saline nasal spray and afrin nasal spray.  Has appt with allergist 07/2021.  No chest pain.  No increased sob reported.  No increased abdominal pain reported.  Bowels appear to be stable.  Linzess.     Past Medical History:  Diagnosis Date   Chest discomfort    Dyspnea on exertion    GERD (gastroesophageal reflux disease)    Hyperlipidemia    Hypertension    Morbid obesity ([place])    Overactive bladder    History reviewed. No pertinent surgical history. Family History  Problem Relation Age of Onset   Prostate cancer Paternal Grandfather        MI @ 2.   Heart disease Paternal Grandfather    Prostate cancer Paternal Uncle    Lung cancer Maternal Grandmother    Hypertension Mother        currently in her 53's   Hypercholesterolemia Mother    Diabetes Mother    Hypertension Father        currently in his 38's   Hypercholesterolemia Father    Heart disease Maternal Grandfather        MI in his 70's   Colon cancer Neg Hx    Social History   Socioeconomic History   Marital status: Single    Spouse name: Not on file   Number of children: Not on file   Years of education: Not on file   Highest education level: Not on file  Occupational History    Occupation: Energy manager: [employer]  Tobacco Use   Smoking status: Never   Smokeless tobacco: Current    Types: Chew  Vaping Use   Vaping Use: Never used  Substance and Sexual Activity   Alcohol use: Yes    Alcohol/week: 0.0 standard drinks    Comment: three to four beers a month.   Drug use: No   Sexual activity: Not on file  Other Topics Concern   Not on file  Social History Narrative   Lives in [place] with family.  Single.  No children.  Does routinely exercise - wt training, treadmill.   Social Determinants of Health   Financial Resource Strain: Not on file  Food Insecurity: Not on file  Transportation Needs: Not on file  Physical Activity: Not on file  Stress: Not on file  Social Connections: Not on file     Review of Systems  Constitutional:  Negative for appetite change and unexpected weight change.  HENT:  Positive for congestion. Negative for sinus pressure.   Respiratory:  Negative for cough, chest tightness and shortness of breath.   Cardiovascular:  Negative for chest pain, palpitations and leg swelling.  Gastrointestinal:  Negative for abdominal pain, diarrhea, nausea and vomiting.  Genitourinary:  Negative for difficulty urinating and dysuria.  Musculoskeletal:  Negative for joint swelling and myalgias.  Skin:  Negative for color change and rash.  Neurological:  Negative for dizziness, light-headedness and headaches.  Psychiatric/Behavioral:  Negative for agitation and dysphoric mood.       Objective:     BP 136/82   Pulse 100   Temp 97.6 F (36.4 C)   Resp 16   Ht 5' 11" (1.803 m)   Wt 294 lb (133.4 kg)   SpO2 98%   BMI 41.00 kg/m  Wt Readings from Last 3 Encounters:  07/12/21 294 lb (133.4 kg)  03/08/21 288 lb 9.6 oz (130.9 kg)  12/21/20 284 lb (128.8 kg)    Physical Exam Constitutional:      General: He is not in acute distress.    Appearance: Normal appearance. He is well-developed.  HENT:      Head: Normocephalic and atraumatic.     Right Ear: External ear normal.     Left Ear: External ear normal.  Eyes:     General: No scleral icterus.       Right eye: No discharge.        Left eye: No discharge.  Cardiovascular:     Rate and Rhythm: Normal rate and regular rhythm.  Pulmonary:     Effort: Pulmonary effort is normal. No respiratory distress.     Breath sounds: Normal breath sounds.  Abdominal:     General: Bowel sounds are normal.     Palpations: Abdomen is soft.     Tenderness: There is no abdominal tenderness.  Musculoskeletal:        General: No swelling or tenderness.     Cervical back: Neck supple. No tenderness.  Lymphadenopathy:     Cervical: No cervical adenopathy.  Skin:    Findings: No erythema or rash.  Neurological:     Mental Status: He is alert.  Psychiatric:        Mood and Affect: Mood normal.        Behavior: Behavior normal.     Outpatient Encounter Medications as of 07/12/2021  Medication Sig   atorvastatin (LIPITOR) 10 MG tablet Take 1 tablet (10 mg total) by mouth daily.   Azelastine-Fluticasone 137-50 MCG/ACT SUSP 1 spray each nostril bid   LINZESS 145 MCG CAPS capsule Take 145 mcg by mouth once.   lisinopril (ZESTRIL) 10 MG tablet Take 1 tablet (10 mg total) by mouth daily.   Multiple Vitamin (MULTIVITAMIN) tablet Take 1 tablet by mouth daily.   [DISCONTINUED] atorvastatin (LIPITOR) 10 MG tablet TAKE 1 TABLET(10 MG) BY MOUTH DAILY   [DISCONTINUED] levocetirizine (XYZAL) 5 MG tablet TAKE 1 TABLET(5 MG) BY MOUTH DAILY   [DISCONTINUED] lisinopril (ZESTRIL) 10 MG tablet TAKE 1 TABLET(10 MG) BY MOUTH DAILY   No facility-administered encounter medications on file as of 07/12/2021.     Lab Results  Component Value Date   WBC 8.9 06/07/2021   HGB 15.5 06/07/2021   HCT 46.5 06/07/2021   PLT 364.0 06/07/2021   GLUCOSE 95 06/07/2021   CHOL 164 06/07/2021   TRIG 141.0 06/07/2021   HDL 56.10 06/07/2021   LDLDIRECT 159.0 11/06/2014    LDLCALC 79 06/07/2021   ALT 44 06/07/2021   AST 25 06/07/2021   NA 136 06/07/2021   K 4.2 06/07/2021   CL 98 06/07/2021   CREATININE 0.88 06/07/2021   BUN 12 06/07/2021     CO2 30 06/07/2021   TSH 2.28 08/31/2020   HGBA1C 5.8 06/07/2021    CT Abdomen Pelvis W Contrast  Result Date: 12/06/2019 CLINICAL DATA:  Mid abdominal pain with constipation for 6 months EXAM: CT ABDOMEN AND PELVIS WITH CONTRAST TECHNIQUE: Multidetector CT imaging of the abdomen and pelvis was performed using the standard protocol following bolus administration of intravenous contrast. CONTRAST:  100mL OMNIPAQUE IOHEXOL 300 MG/ML  SOLN COMPARISON:  Abdominal ultrasound 02/04/2019 FINDINGS: Lower chest: No acute abnormality. Hepatobiliary: No focal liver abnormality is seen. No gallstones, gallbladder wall thickening, or biliary dilatation. Pancreas: Unremarkable. No pancreatic ductal dilatation or surrounding inflammatory changes. Spleen: Normal in size without focal abnormality. Adrenals/Urinary Tract: Adrenal glands are unremarkable. Kidneys are normal, without renal calculi, focal lesion, or hydronephrosis. Bladder is decompressed. Stomach/Bowel: Stomach and bowel are well distended with oral contrast. Stomach is within normal limits. Appendix appears normal. Long segment mild circumferential wall thickening of the descending and sigmoid colon (series 4, image 77). There are a few scattered diverticula. No short segment bowel wall thickening. No dilated loops of bowel. No pericolonic inflammatory changes. Mild-moderate volume of stool throughout the colon. Vascular/Lymphatic: No significant vascular findings are present. No enlarged abdominal or pelvic lymph nodes. Reproductive: Prostate is unremarkable. Other: No abdominal wall hernia or abnormality. No abdominopelvic ascites. Musculoskeletal: Transitional lumbosacral anatomy with partial lumbarization of the S1 segment. No acute osseous findings. IMPRESSION: 1. Long segment mild  circumferential wall thickening of the descending and sigmoid colon, which may represent a mild infectious or inflammatory colitis. 2. Mild-moderate volume of stool throughout the colon. Electronically Signed   By: Nicholas  Plundo D.O.   On: 12/06/2019 13:46       Assessment & Plan:   Problem List Items Addressed This Visit     GERD (gastroesophageal reflux disease)    No acid reflux reported.  Continue protonix.        Hypercholesterolemia    Continue lipitor.  Low cholesterol diet and exercise.  Follow lipid panel and liver function tests.        Relevant Medications   atorvastatin (LIPITOR) 10 MG tablet   lisinopril (ZESTRIL) 10 MG tablet   Other Relevant Orders   Lipid panel   Hepatic function panel   Hyperglycemia    Low carb diet and exercise.  Follow met b and a1c.       Relevant Orders   Hemoglobin A1c   Hypertension, essential - Primary    Continue lisinopril.  Blood pressure doing well. Follow pressures.  Follow metabolic panel.       Relevant Medications   atorvastatin (LIPITOR) 10 MG tablet   lisinopril (ZESTRIL) 10 MG tablet   Other Relevant Orders   TSH   Basic metabolic panel   Nasal congestion    Persistent congestion as outlined.  Steroid nasal spray.  claritin D, saline nasal spray.  Has used afrin nasal spray - prn.  Has appt with allergist 07/2021.         Sleep apnea    Continue cpap.       Thrombocytosis    Follow cbc.        Other Visit Diagnoses     Encounter for hepatitis C screening test for low risk patient       Relevant Orders   Hepatitis C antibody   Need for immunization against influenza       Relevant Orders   Flu Vaccine QUAD 6mo+IM (Fluarix, Fluzone & Alfiuria Quad PF) (Completed)          Charlene Scott, MD  

## 2021-07-20 ENCOUNTER — Encounter: Payer: Self-pay | Admitting: Internal Medicine

## 2021-07-20 DIAGNOSIS — R0981 Nasal congestion: Secondary | ICD-10-CM | POA: Insufficient documentation

## 2021-07-20 NOTE — Assessment & Plan Note (Signed)
Low carb diet and exercise.  Follow met b and a1c.  

## 2021-07-20 NOTE — Assessment & Plan Note (Signed)
Follow cbc.  

## 2021-07-20 NOTE — Assessment & Plan Note (Signed)
Persistent congestion as outlined.  Steroid nasal spray.  claritin D, saline nasal spray.  Has used afrin nasal spray - prn.  Has appt with allergist 07/2021.

## 2021-07-20 NOTE — Assessment & Plan Note (Signed)
Continue lipitor.  Low cholesterol diet and exercise.  Follow lipid panel and liver function tests.   

## 2021-07-20 NOTE — Assessment & Plan Note (Signed)
No acid reflux reported.  Continue protonix.  

## 2021-07-20 NOTE — Assessment & Plan Note (Signed)
Continue cpap.  

## 2021-07-20 NOTE — Assessment & Plan Note (Signed)
Continue lisinopril.  Blood pressure doing well.  Follow pressures.  Follow metabolic panel.  

## 2021-10-11 ENCOUNTER — Other Ambulatory Visit: Payer: Self-pay

## 2021-10-11 ENCOUNTER — Other Ambulatory Visit (INDEPENDENT_AMBULATORY_CARE_PROVIDER_SITE_OTHER): Payer: BC Managed Care – PPO

## 2021-10-11 ENCOUNTER — Encounter: Payer: Self-pay | Admitting: Internal Medicine

## 2021-10-11 DIAGNOSIS — E7800 Pure hypercholesterolemia, unspecified: Secondary | ICD-10-CM | POA: Diagnosis not present

## 2021-10-11 DIAGNOSIS — R739 Hyperglycemia, unspecified: Secondary | ICD-10-CM | POA: Diagnosis not present

## 2021-10-11 DIAGNOSIS — Z1159 Encounter for screening for other viral diseases: Secondary | ICD-10-CM

## 2021-10-11 DIAGNOSIS — I10 Essential (primary) hypertension: Secondary | ICD-10-CM | POA: Diagnosis not present

## 2021-10-11 LAB — BASIC METABOLIC PANEL
BUN: 9 mg/dL (ref 6–23)
CO2: 32 mEq/L (ref 19–32)
Calcium: 9.5 mg/dL (ref 8.4–10.5)
Chloride: 98 mEq/L (ref 96–112)
Creatinine, Ser: 0.82 mg/dL (ref 0.40–1.50)
GFR: 109.96 mL/min (ref >60.00)
Glucose, Bld: 98 mg/dL (ref 70–99)
Potassium: 4.1 mEq/L (ref 3.5–5.1)
Sodium: 135 mEq/L (ref 135–145)

## 2021-10-11 LAB — HEPATIC FUNCTION PANEL
ALT: 59 U/L — ABNORMAL HIGH (ref 0–53)
AST: 33 U/L (ref 0–37)
Albumin: 4.7 g/dL (ref 3.5–5.2)
Alkaline Phosphatase: 100 U/L (ref 39–117)
Bilirubin, Direct: 0.2 mg/dL (ref 0.0–0.3)
Total Bilirubin: 0.8 mg/dL (ref 0.2–1.2)
Total Protein: 7.2 g/dL (ref 6.0–8.3)

## 2021-10-11 LAB — LIPID PANEL
Cholesterol: 176 mg/dL (ref 0–200)
HDL: 58.3 mg/dL (ref >39.00)
LDL Cholesterol: 87 mg/dL (ref 0–99)
NonHDL: 117.76
Total CHOL/HDL Ratio: 3
Triglycerides: 153 mg/dL — ABNORMAL HIGH (ref 0.0–149.0)
VLDL: 30.6 mg/dL (ref 0.0–40.0)

## 2021-10-11 LAB — TSH: TSH: 1.94 u[IU]/mL (ref 0.35–5.50)

## 2021-10-11 LAB — HEMOGLOBIN A1C: Hgb A1c MFr Bld: 6.1 % (ref 4.6–6.5)

## 2021-10-14 LAB — HEPATITIS C ANTIBODY
Hepatitis C Ab: NONREACTIVE
SIGNAL TO CUT-OFF: 0.03 (ref <1.00)

## 2021-10-15 ENCOUNTER — Other Ambulatory Visit: Payer: BC Managed Care – PPO

## 2021-10-18 ENCOUNTER — Ambulatory Visit: Payer: BC Managed Care – PPO | Admitting: Internal Medicine

## 2021-10-29 ENCOUNTER — Other Ambulatory Visit: Payer: Self-pay | Admitting: *Deleted

## 2021-10-29 DIAGNOSIS — R748 Abnormal levels of other serum enzymes: Secondary | ICD-10-CM

## 2021-11-01 ENCOUNTER — Other Ambulatory Visit: Payer: Self-pay

## 2021-11-01 ENCOUNTER — Other Ambulatory Visit (INDEPENDENT_AMBULATORY_CARE_PROVIDER_SITE_OTHER): Payer: BC Managed Care – PPO

## 2021-11-01 DIAGNOSIS — R748 Abnormal levels of other serum enzymes: Secondary | ICD-10-CM | POA: Diagnosis not present

## 2021-11-01 LAB — HEPATIC FUNCTION PANEL
ALT: 54 U/L — ABNORMAL HIGH (ref 0–53)
AST: 31 U/L (ref 0–37)
Albumin: 4.7 g/dL (ref 3.5–5.2)
Alkaline Phosphatase: 99 U/L (ref 39–117)
Bilirubin, Direct: 0.1 mg/dL (ref 0.0–0.3)
Total Bilirubin: 0.6 mg/dL (ref 0.2–1.2)
Total Protein: 7.4 g/dL (ref 6.0–8.3)

## 2021-11-04 ENCOUNTER — Telehealth: Payer: Self-pay

## 2021-11-04 ENCOUNTER — Other Ambulatory Visit: Payer: Self-pay | Admitting: Internal Medicine

## 2021-11-04 DIAGNOSIS — R7989 Other specified abnormal findings of blood chemistry: Secondary | ICD-10-CM

## 2021-11-04 NOTE — Telephone Encounter (Signed)
Pt returning your call

## 2021-11-04 NOTE — Telephone Encounter (Signed)
Spoke with patient & he is agreeable to abdominal US.  ?

## 2021-11-04 NOTE — Telephone Encounter (Signed)
LMTCB

## 2021-11-04 NOTE — Telephone Encounter (Signed)
-----   Message from Dale [place], MD sent at 11/04/2021  6:05 AM EDT ----- ?Notify - one liver test slightly increased.  Remainder of liver panel wnl.  Could be fatty liver.  I would like to schedule an abdominal ultrasound to further evaluate liver.   ?

## 2021-11-04 NOTE — Progress Notes (Signed)
Order placed for abdominal ultrasound.   

## 2021-11-04 NOTE — Telephone Encounter (Signed)
Patient returned office phone call. 

## 2021-11-04 NOTE — Telephone Encounter (Signed)
Order placed for abdominal ultrasound.   

## 2021-11-15 ENCOUNTER — Other Ambulatory Visit: Payer: Self-pay

## 2021-11-15 ENCOUNTER — Ambulatory Visit: Payer: BC Managed Care – PPO | Admitting: Internal Medicine

## 2021-11-15 ENCOUNTER — Ambulatory Visit: Payer: BC Managed Care – PPO

## 2021-11-15 ENCOUNTER — Encounter: Payer: Self-pay | Admitting: Internal Medicine

## 2021-11-15 VITALS — BP 132/74 | HR 90 | Temp 97.9°F | Resp 16 | Ht 71.0 in | Wt 300.0 lb

## 2021-11-15 DIAGNOSIS — G4730 Sleep apnea, unspecified: Secondary | ICD-10-CM

## 2021-11-15 DIAGNOSIS — Z125 Encounter for screening for malignant neoplasm of prostate: Secondary | ICD-10-CM | POA: Diagnosis not present

## 2021-11-15 DIAGNOSIS — E7800 Pure hypercholesterolemia, unspecified: Secondary | ICD-10-CM

## 2021-11-15 DIAGNOSIS — I10 Essential (primary) hypertension: Secondary | ICD-10-CM

## 2021-11-15 DIAGNOSIS — K219 Gastro-esophageal reflux disease without esophagitis: Secondary | ICD-10-CM

## 2021-11-15 DIAGNOSIS — R7989 Other specified abnormal findings of blood chemistry: Secondary | ICD-10-CM

## 2021-11-15 DIAGNOSIS — R739 Hyperglycemia, unspecified: Secondary | ICD-10-CM

## 2021-11-15 DIAGNOSIS — D75839 Thrombocytosis, unspecified: Secondary | ICD-10-CM

## 2021-11-15 MED ORDER — LISINOPRIL 20 MG PO TABS: 20.0000 mg | ORAL_TABLET | Freq: Every day | ORAL | 3 refills | Status: DC

## 2021-11-15 NOTE — Progress Notes (Signed)
Patient ID: Gary Fleming, male   DOB: Aug 05, 1981, 41 y.o.   MRN: [id number] ? ? ?Subjective:  ? ? Patient ID: Gary MacMatthew Y Fambro, male    DOB: Aug 05, 1981, 41 y.o.   MRN: [id number] ? ?This visit occurred during the SARS-CoV-2 public health emergency.  Safety protocols were in place, including screening questions prior to the visit, additional usage of staff PPE, and extensive cleaning of exam room while observing appropriate contact time as indicated for disinfecting solutions.  ? ?Patient here for a scheduled follow up.  ? ? ?HPI ?Here to follow up regarding his blood pressure, cholesterol and GI issues.  Reports he is doing relatively well.  No chest pain or sob reported.  No abdominal pain.  Still bloated.  No specific triggers.  Overall GI symptoms are improved.  No cough or congestion.  Recent labs - elevated ALT.  Scheduled for abdominal ultrasound next week.  Blood pressures - in pm - averaging mid 130s.   ? ? ?Past Medical History:  ?Diagnosis Date  ? Chest discomfort   ? Dyspnea on exertion   ? GERD (gastroesophageal reflux disease)   ? Hyperlipidemia   ? Hypertension   ? Morbid obesity ([facility])   ? Overactive bladder   ? ?History reviewed. No pertinent surgical history. ?Family History  ?Problem Relation Age of Onset  ? Prostate cancer Paternal Grandfather   ?     MI @ 40.  ? Heart disease Paternal Grandfather   ? Prostate cancer Paternal Uncle   ? Lung cancer Maternal Grandmother   ? Hypertension Mother   ?     currently in her 350's  ? Hypercholesterolemia Mother   ? Diabetes Mother   ? Hypertension Father   ?     currently in his 7950's  ? Hypercholesterolemia Father   ? Heart disease Maternal Grandfather   ?     MI in his 4170's  ? Colon cancer Neg Hx   ? ?Social History  ? ?Socioeconomic History  ? Marital status: Single  ?  Spouse name: Not on file  ? Number of children: Not on file  ? Years of education: Not on file  ? Highest education level: Not on file  ?Occupational History  ? Occupation: CytogeneticistBridge Inspector   ?  Employer: [employer]  ?Tobacco Use  ? Smoking status: Never  ? Smokeless tobacco: Current  ?  Types: Chew  ?Vaping Use  ? Vaping Use: Never used  ?Substance and Sexual Activity  ? Alcohol use: Yes  ?  Alcohol/week: 0.0 standard drinks  ?  Comment: three to four beers a month.  ? Drug use: No  ? Sexual activity: Not on file  ?Other Topics Concern  ? Not on file  ?Social History Narrative  ? Lives in [place] with family.  Single.  No children.  Does routinely exercise - wt training, treadmill.  ? ?Social Determinants of Health  ? ?Financial Resource Strain: Not on file  ?Food Insecurity: Not on file  ?Transportation Needs: Not on file  ?Physical Activity: Not on file  ?Stress: Not on file  ?Social Connections: Not on file  ? ? ? ?Review of Systems  ?Constitutional:  Negative for appetite change and unexpected weight change.  ?HENT:  Negative for congestion and sinus pressure.   ?Respiratory:  Negative for cough, chest tightness and shortness of breath.   ?Cardiovascular:  Negative for chest pain, palpitations and leg swelling.  ?Gastrointestinal:  Negative for abdominal pain,  diarrhea, nausea and vomiting.  ?Genitourinary:  Negative for difficulty urinating and dysuria.  ?Musculoskeletal:  Negative for joint swelling and myalgias.  ?Skin:  Negative for color change and rash.  ?Neurological:  Negative for dizziness, light-headedness and headaches.  ?Psychiatric/Behavioral:  Negative for agitation and dysphoric mood.   ? ?   ?Objective:  ?  ? ?BP 132/74   Pulse 90   Temp 97.9 ?F (36.6 ?C)   Resp 16   Ht 5\' 11"  (1.803 m)   Wt 300 lb (136.1 kg)   SpO2 99%   BMI 41.84 kg/m?  ?Wt Readings from Last 3 Encounters:  ?11/15/21 300 lb (136.1 kg)  ?07/12/21 294 lb (133.4 kg)  ?03/08/21 288 lb 9.6 oz (130.9 kg)  ? ? ?Physical Exam ?Vitals reviewed.  ?Constitutional:   ?   General: He is not in acute distress. ?   Appearance: Normal appearance. He is well-developed.  ?HENT:  ?   Head: Normocephalic and  atraumatic.  ?   Right Ear: External ear normal.  ?   Left Ear: External ear normal.  ?Eyes:  ?   General: No scleral icterus.    ?   Right eye: No discharge.     ?   Left eye: No discharge.  ?   Conjunctiva/sclera: Conjunctivae normal.  ?Cardiovascular:  ?   Rate and Rhythm: Normal rate and regular rhythm.  ?Pulmonary:  ?   Effort: Pulmonary effort is normal. No respiratory distress.  ?   Breath sounds: Normal breath sounds.  ?Abdominal:  ?   General: Bowel sounds are normal.  ?   Palpations: Abdomen is soft.  ?   Tenderness: There is no abdominal tenderness.  ?Musculoskeletal:     ?   General: No swelling or tenderness.  ?   Cervical back: Neck supple. No tenderness.  ?Lymphadenopathy:  ?   Cervical: No cervical adenopathy.  ?Skin: ?   Findings: No erythema or rash.  ?Neurological:  ?   Mental Status: He is alert.  ?Psychiatric:     ?   Mood and Affect: Mood normal.     ?   Behavior: Behavior normal.  ? ? ? ?Outpatient Encounter Medications as of 11/15/2021  ?Medication Sig  ? atorvastatin (LIPITOR) 10 MG tablet Take 1 tablet (10 mg total) by mouth daily.  ? Azelastine-Fluticasone 137-50 MCG/ACT SUSP 1 spray each nostril bid  ? LINZESS 145 MCG CAPS capsule Take 145 mcg by mouth once.  ? lisinopril (ZESTRIL) 20 MG tablet Take 1 tablet (20 mg total) by mouth daily.  ? Multiple Vitamin (MULTIVITAMIN) tablet Take 1 tablet by mouth daily.  ? [DISCONTINUED] lisinopril (ZESTRIL) 10 MG tablet Take 1 tablet (10 mg total) by mouth daily.  ? ?No facility-administered encounter medications on file as of 11/15/2021.  ?  ? ?Lab Results  ?Component Value Date  ? WBC 8.9 06/07/2021  ? HGB 15.5 06/07/2021  ? HCT 46.5 06/07/2021  ? PLT 364.0 06/07/2021  ? GLUCOSE 98 10/11/2021  ? CHOL 176 10/11/2021  ? TRIG 153.0 (H) 10/11/2021  ? HDL 58.30 10/11/2021  ? LDLDIRECT 159.0 11/06/2014  ? LDLCALC 87 10/11/2021  ? ALT 54 (H) 11/01/2021  ? AST 31 11/01/2021  ? NA 135 10/11/2021  ? K 4.1 10/11/2021  ? CL 98 10/11/2021  ? CREATININE 0.82  10/11/2021  ? BUN 9 10/11/2021  ? CO2 32 10/11/2021  ? TSH 1.94 10/11/2021  ? HGBA1C 6.1 10/11/2021  ? ? ?CT Abdomen Pelvis W Contrast ? ?  Result Date: 12/06/2019 ?CLINICAL DATA:  Mid abdominal pain with constipation for 6 months EXAM: CT ABDOMEN AND PELVIS WITH CONTRAST TECHNIQUE: Multidetector CT imaging of the abdomen and pelvis was performed using the standard protocol following bolus administration of intravenous contrast. CONTRAST:  OMNIPAQUE IOHEXOL 300 MG/ML  SOLN COMPARISON:  Abdominal ultrasound 02/04/2019 FINDINGS: Lower chest: No acute abnormality. Hepatobiliary: No focal liver abnormality is seen. No gallstones, gallbladder wall thickening, or biliary dilatation. Pancreas: Unremarkable. No pancreatic ductal dilatation or surrounding inflammatory changes. Spleen: Normal in size without focal abnormality. Adrenals/Urinary Tract: Adrenal glands are unremarkable. Kidneys are normal, without renal calculi, focal lesion, or hydronephrosis. Bladder is decompressed. Stomach/Bowel: Stomach and bowel are well distended with oral contrast. Stomach is within normal limits. Appendix appears normal. Long segment mild circumferential wall thickening of the descending and sigmoid colon (series 4, image 77). There are a few scattered diverticula. No short segment bowel wall thickening. No dilated loops of bowel. No pericolonic inflammatory changes. Mild-moderate volume of stool throughout the colon. Vascular/Lymphatic: No significant vascular findings are present. No enlarged abdominal or pelvic lymph nodes. Reproductive: Prostate is unremarkable. Other: No abdominal wall hernia or abnormality. No abdominopelvic ascites. Musculoskeletal: Transitional lumbosacral anatomy with partial lumbarization of the S1 segment. No acute osseous findings. IMPRESSION: 1. Long segment mild circumferential wall thickening of the descending and sigmoid colon, which may represent a mild infectious or inflammatory colitis. 2.  Mild-moderate volume of stool throughout the colon. Electronically Signed   By: Duanne Guess D.O.   On: 12/06/2019 13:46  ? ? ?   ?Assessment & Plan:  ? ?Problem List Items Addressed This Visit   ? ? Abnormal liver f

## 2021-11-16 ENCOUNTER — Encounter: Payer: Self-pay | Admitting: Internal Medicine

## 2021-11-16 DIAGNOSIS — R7989 Other specified abnormal findings of blood chemistry: Secondary | ICD-10-CM | POA: Insufficient documentation

## 2021-11-16 NOTE — Assessment & Plan Note (Signed)
Low carb diet and exercise.  Follow met b and a1c.  ?

## 2021-11-16 NOTE — Assessment & Plan Note (Signed)
Continue lisinopril.  Blood pressure doing well.  Follow pressures.  Follow metabolic panel.  

## 2021-11-16 NOTE — Assessment & Plan Note (Signed)
Continue cpap.  

## 2021-11-16 NOTE — Assessment & Plan Note (Signed)
Last platelet count wnl.  Follow.  

## 2021-11-16 NOTE — Assessment & Plan Note (Signed)
No acid reflux reported.  Continue protonix.  

## 2021-11-16 NOTE — Assessment & Plan Note (Signed)
Scheduled for abdominal ultrasound next week.  ?

## 2021-11-16 NOTE — Assessment & Plan Note (Signed)
Continue lipitor.  Low cholesterol diet and exercise.  Follow lipid panel and liver function tests.   

## 2021-11-18 ENCOUNTER — Ambulatory Visit: Payer: BC Managed Care – PPO

## 2021-11-21 ENCOUNTER — Ambulatory Visit: Payer: BC Managed Care – PPO

## 2021-11-22 ENCOUNTER — Ambulatory Visit
Admission: RE | Admit: 2021-11-22 | Discharge: 2021-11-22 | Disposition: A | Discharge status: Home / Self Care | Payer: BC Managed Care – PPO | Source: Ambulatory Visit | Attending: Internal Medicine | Admitting: Internal Medicine

## 2021-11-22 DIAGNOSIS — R7989 Other specified abnormal findings of blood chemistry: Secondary | ICD-10-CM | POA: Insufficient documentation

## 2021-11-30 ENCOUNTER — Other Ambulatory Visit: Payer: Self-pay | Admitting: Internal Medicine

## 2021-12-20 ENCOUNTER — Other Ambulatory Visit: Payer: BC Managed Care – PPO

## 2021-12-27 ENCOUNTER — Other Ambulatory Visit (INDEPENDENT_AMBULATORY_CARE_PROVIDER_SITE_OTHER): Payer: BC Managed Care – PPO

## 2021-12-27 DIAGNOSIS — E7800 Pure hypercholesterolemia, unspecified: Secondary | ICD-10-CM

## 2021-12-27 LAB — HEPATIC FUNCTION PANEL
ALT: 49 U/L (ref 0–53)
AST: 29 U/L (ref 0–37)
Albumin: 4.3 g/dL (ref 3.5–5.2)
Alkaline Phosphatase: 95 U/L (ref 39–117)
Bilirubin, Direct: 0.1 mg/dL (ref 0.0–0.3)
Total Bilirubin: 0.5 mg/dL (ref 0.2–1.2)
Total Protein: 7.1 g/dL (ref 6.0–8.3)

## 2022-01-28 IMAGING — CT CT ABD-PELV W/ CM
2 of 4 series · 15 of 46 positions shown, 17 images · IV contrast (omnipaque)
Comparison: Abdominal ultrasound 02/04/2019

CLINICAL DATA: Mid abdominal pain with constipation for 6 months

EXAM:
CT ABDOMEN AND PELVIS WITH CONTRAST
TECHNIQUE: Multidetector CT imaging of the abdomen and pelvis was performed
using the standard protocol following bolus administration of
intravenous contrast.
CONTRAST:  100mL OMNIPAQUE IOHEXOL 300 MG/ML  SOLN

[Series 2: abd pelvis 5.00 · axial · 0.74mm/px · z∈[-1575,-1070]mm · 12 of 111 slices shown, 14 images]
[im 5/111  soft-tissue]
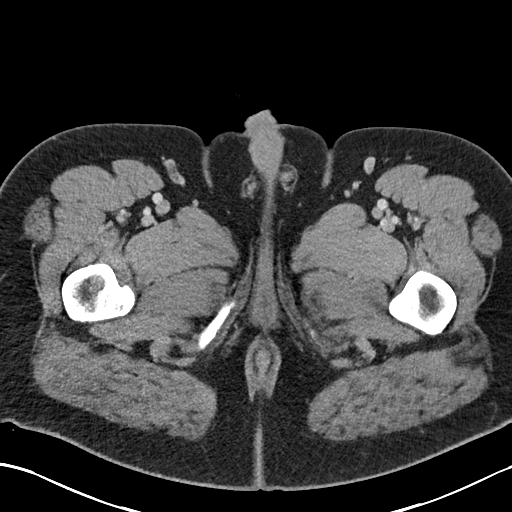
[im 5/111  bone]
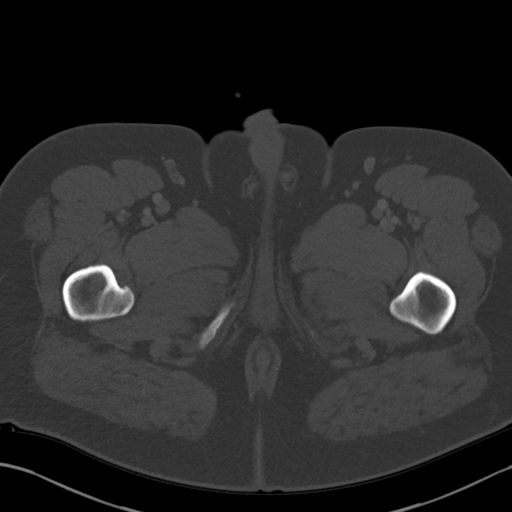
[im 15/111  soft-tissue]
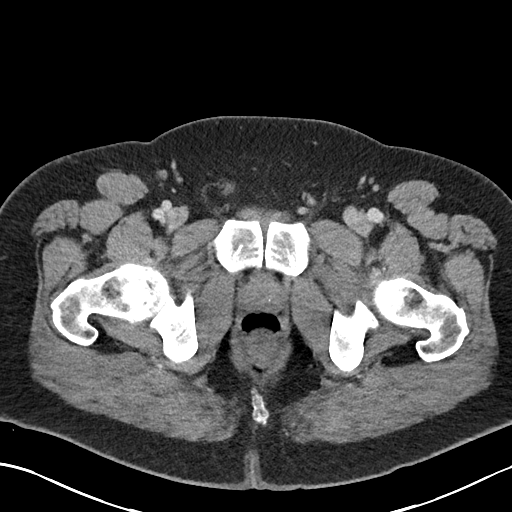
[im 24/111  soft-tissue]
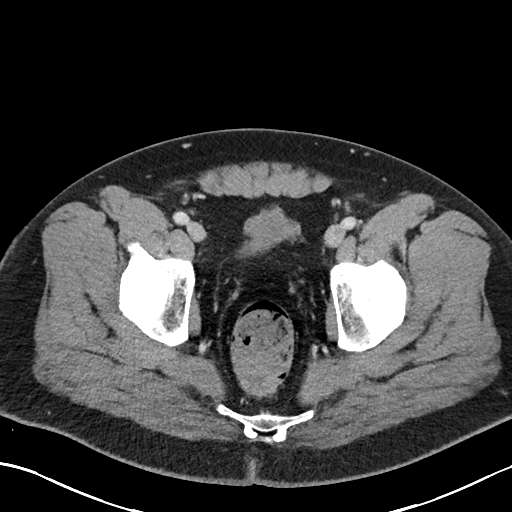
[im 34/111  soft-tissue]
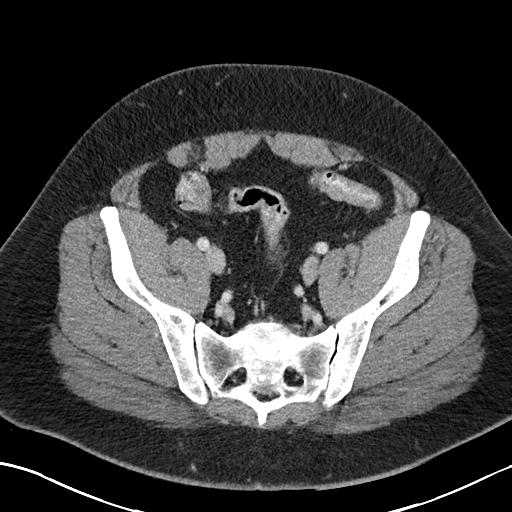
[im 44/111  soft-tissue]
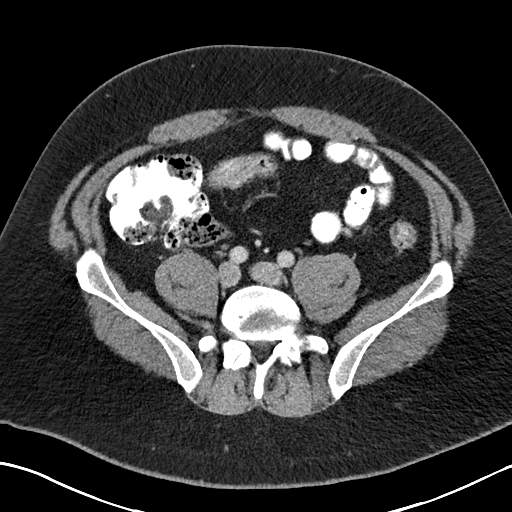
[im 53/111  soft-tissue]
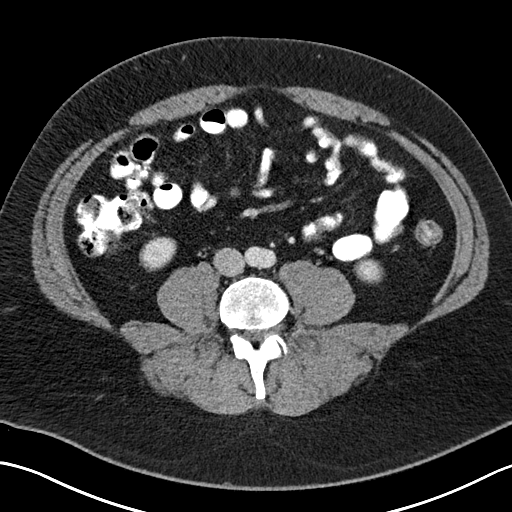
[im 58/111  soft-tissue]
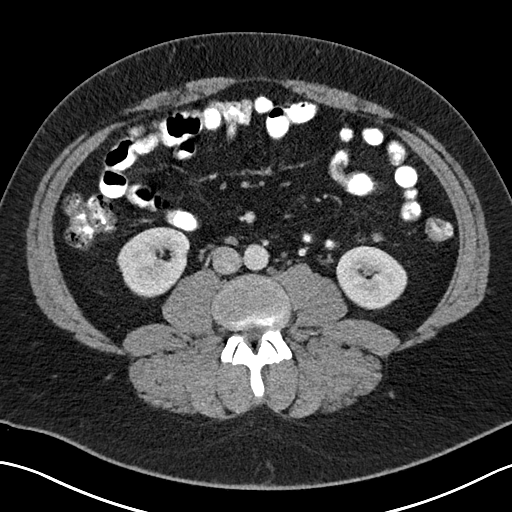
[im 67/111  soft-tissue]
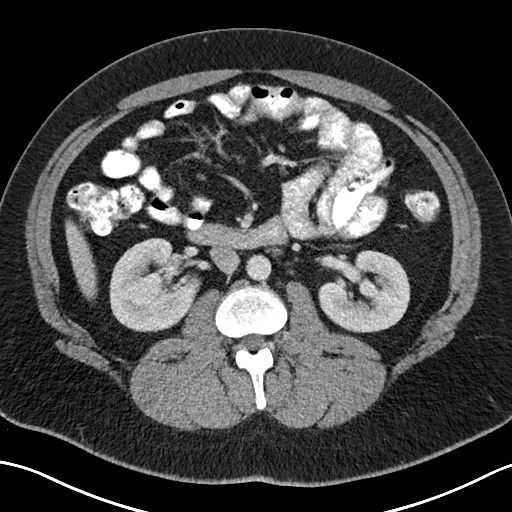
[im 77/111  soft-tissue]
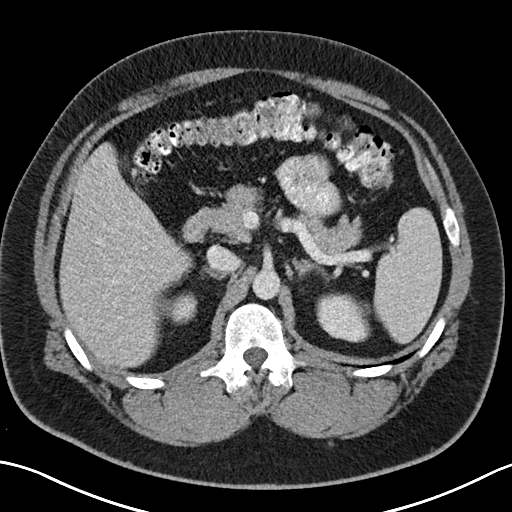
[im 77/111  bone]
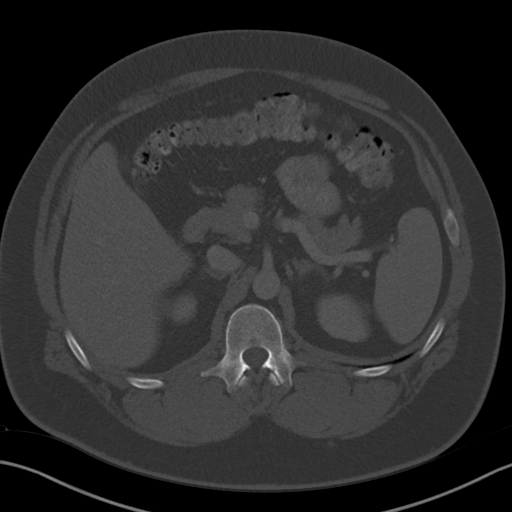
[im 87/111  soft-tissue]
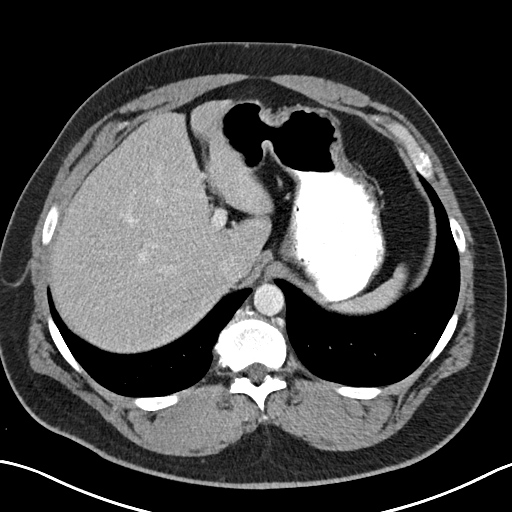
[im 96/111  soft-tissue]
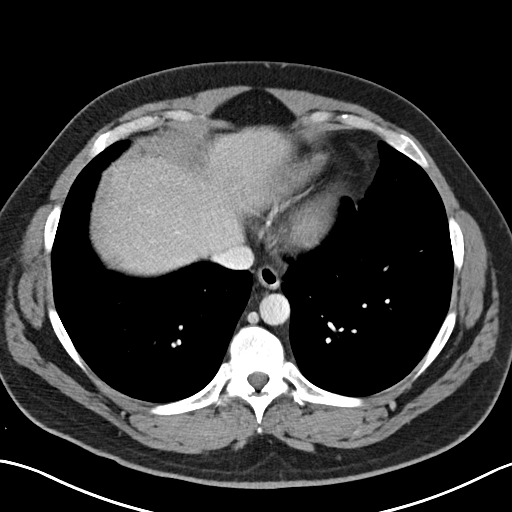
[im 106/111  soft-tissue]
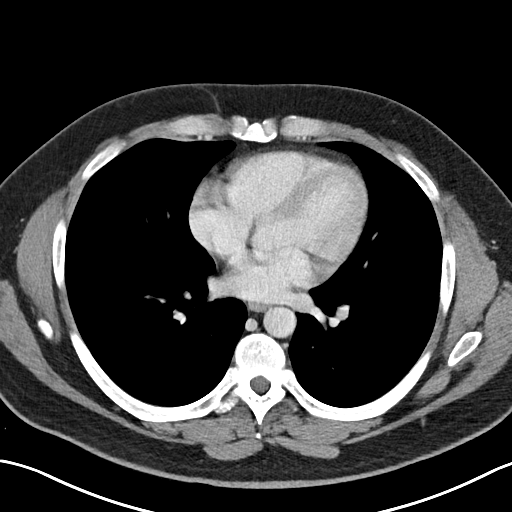

[Series 4: coronals abd pelvis 2.00 cor · coronal · 0.74mm/px · 3 of 173 slices shown]
[im 58/173  soft-tissue]
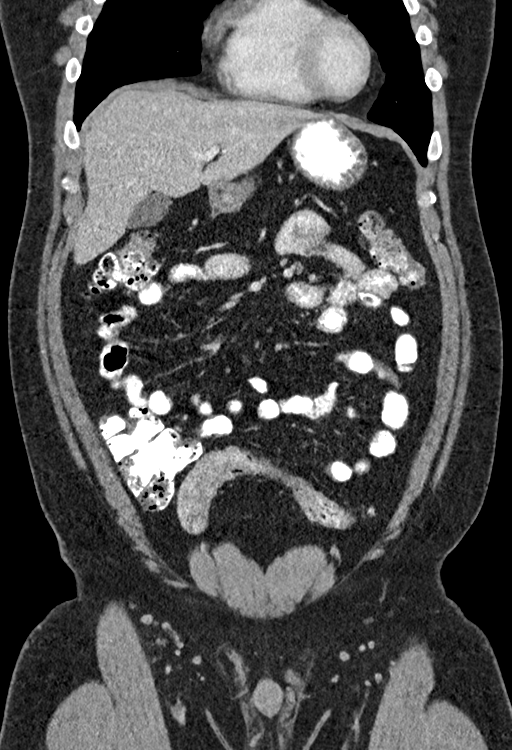
[im 77/173  soft-tissue]
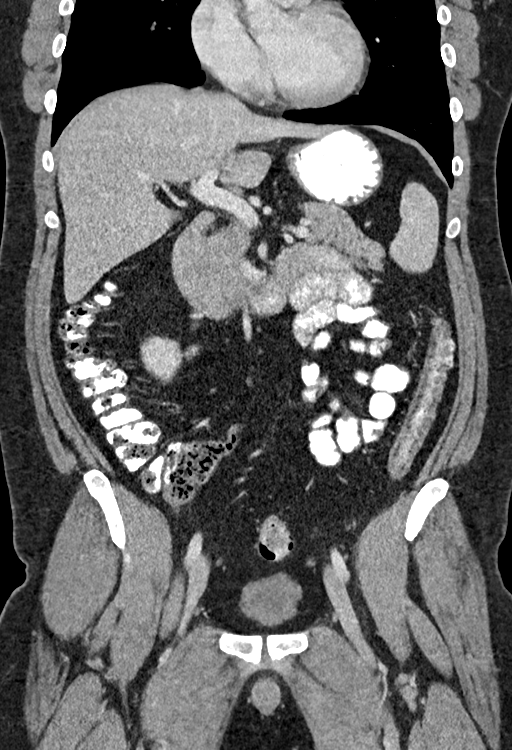
[im 96/173  soft-tissue]
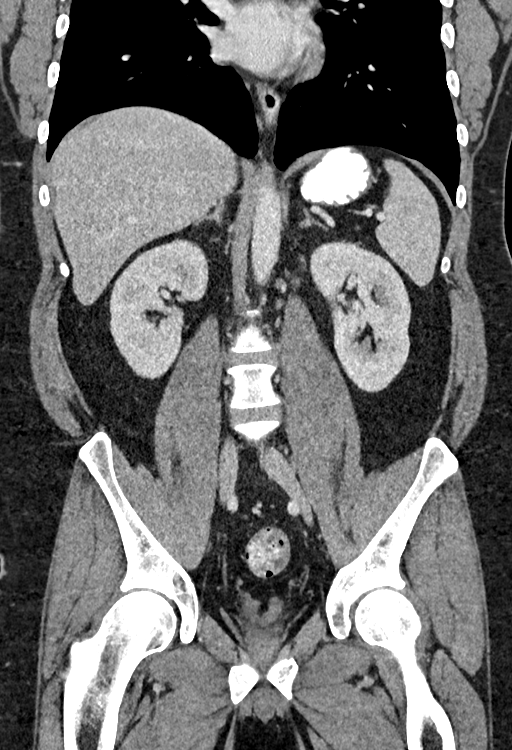

[15 of 46 positions shown; findings below may reference images not displayed]

FINDINGS: Lower chest: No acute abnormality.

Hepatobiliary: No focal liver abnormality is seen. No gallstones,
gallbladder wall thickening, or biliary dilatation.

Pancreas: Unremarkable. No pancreatic ductal dilatation or
surrounding inflammatory changes.

Spleen: Normal in size without focal abnormality.

Adrenals/Urinary Tract: Adrenal glands are unremarkable. Kidneys are
normal, without renal calculi, focal lesion, or hydronephrosis.
Bladder is decompressed.

Stomach/Bowel: Stomach and bowel are well distended with oral
contrast. Stomach is within normal limits. Appendix appears normal.
Long segment mild circumferential wall thickening of the descending
and sigmoid colon (series 4, image 77). There are a few scattered
diverticula. No short segment bowel wall thickening. No dilated
loops of bowel. No pericolonic inflammatory changes. Mild-moderate
volume of stool throughout the colon.

Vascular/Lymphatic: No significant vascular findings are present. No
enlarged abdominal or pelvic lymph nodes.

Reproductive: Prostate is unremarkable.

Other: No abdominal wall hernia or abnormality. No abdominopelvic
ascites.

Musculoskeletal: Transitional lumbosacral anatomy with partial
lumbarization of the S1 segment. No acute osseous findings.
IMPRESSION: 1. Long segment mild circumferential wall thickening of the
descending and sigmoid colon, which may represent a mild infectious
or inflammatory colitis.
2. Mild-moderate volume of stool throughout the colon.

## 2022-02-14 ENCOUNTER — Other Ambulatory Visit: Payer: BC Managed Care – PPO

## 2022-02-21 ENCOUNTER — Other Ambulatory Visit: Payer: BC Managed Care – PPO

## 2022-03-17 ENCOUNTER — Other Ambulatory Visit (INDEPENDENT_AMBULATORY_CARE_PROVIDER_SITE_OTHER): Payer: BC Managed Care – PPO

## 2022-03-17 DIAGNOSIS — E7800 Pure hypercholesterolemia, unspecified: Secondary | ICD-10-CM | POA: Diagnosis not present

## 2022-03-17 DIAGNOSIS — I10 Essential (primary) hypertension: Secondary | ICD-10-CM

## 2022-03-17 DIAGNOSIS — R739 Hyperglycemia, unspecified: Secondary | ICD-10-CM | POA: Diagnosis not present

## 2022-03-17 DIAGNOSIS — Z125 Encounter for screening for malignant neoplasm of prostate: Secondary | ICD-10-CM

## 2022-03-17 LAB — BASIC METABOLIC PANEL
BUN: 12 mg/dL (ref 6–23)
CO2: 28 mEq/L (ref 19–32)
Calcium: 9.1 mg/dL (ref 8.4–10.5)
Chloride: 99 mEq/L (ref 96–112)
Creatinine, Ser: 0.86 mg/dL (ref 0.40–1.50)
GFR: 108.06 mL/min (ref >60.00)
Glucose, Bld: 88 mg/dL (ref 70–99)
Potassium: 4.2 mEq/L (ref 3.5–5.1)
Sodium: 139 mEq/L (ref 135–145)

## 2022-03-17 LAB — LIPID PANEL
Cholesterol: 170 mg/dL (ref 0–200)
HDL: 50.9 mg/dL (ref >39.00)
NonHDL: 118.6
Total CHOL/HDL Ratio: 3
Triglycerides: 286 mg/dL — ABNORMAL HIGH (ref 0.0–149.0)
VLDL: 57.2 mg/dL — ABNORMAL HIGH (ref 0.0–40.0)

## 2022-03-17 LAB — HEMOGLOBIN A1C: Hgb A1c MFr Bld: 6.3 % (ref 4.6–6.5)

## 2022-03-17 LAB — LDL CHOLESTEROL, DIRECT: Direct LDL: 93 mg/dL

## 2022-03-17 LAB — PSA: PSA: 0.19 ng/mL (ref 0.10–4.00)

## 2022-03-21 ENCOUNTER — Encounter: Payer: Self-pay | Admitting: Internal Medicine

## 2022-03-21 ENCOUNTER — Ambulatory Visit (INDEPENDENT_AMBULATORY_CARE_PROVIDER_SITE_OTHER): Payer: BC Managed Care – PPO | Admitting: Internal Medicine

## 2022-03-21 VITALS — BP 136/86 | HR 103 | Temp 97.7°F | Resp 18 | Ht 72.0 in | Wt 302.4 lb

## 2022-03-21 DIAGNOSIS — K219 Gastro-esophageal reflux disease without esophagitis: Secondary | ICD-10-CM

## 2022-03-21 DIAGNOSIS — G4730 Sleep apnea, unspecified: Secondary | ICD-10-CM

## 2022-03-21 DIAGNOSIS — D75839 Thrombocytosis, unspecified: Secondary | ICD-10-CM

## 2022-03-21 DIAGNOSIS — R7989 Other specified abnormal findings of blood chemistry: Secondary | ICD-10-CM

## 2022-03-21 DIAGNOSIS — Z0000 Encounter for general adult medical examination without abnormal findings: Secondary | ICD-10-CM | POA: Diagnosis not present

## 2022-03-21 DIAGNOSIS — E7800 Pure hypercholesterolemia, unspecified: Secondary | ICD-10-CM

## 2022-03-21 DIAGNOSIS — R739 Hyperglycemia, unspecified: Secondary | ICD-10-CM

## 2022-03-21 DIAGNOSIS — I10 Essential (primary) hypertension: Secondary | ICD-10-CM | POA: Diagnosis not present

## 2022-03-21 MED ORDER — AMLODIPINE BESYLATE 5 MG PO TABS: 5.0000 mg | ORAL_TABLET | Freq: Every day | ORAL | 2 refills | Status: DC

## 2022-03-21 NOTE — Assessment & Plan Note (Signed)
Physical today 03/21/22.  PSA 03/17/22 - .19

## 2022-03-21 NOTE — Progress Notes (Unsigned)
Patient ID: IBRAHEEM VORIS, male   DOB: [birth year], 41 y.o.   MRN: [id number]   Subjective:    Patient ID: Beverly Gust, male    DOB: 07-08-1981, 41 y.o.   MRN: [id number]   Patient here for his physical exam.   Chief Complaint  Patient presents with   Annual Exam   .   HPI He reports he is doing well.  Feels good.  Stays active.  No chest pain or sob reported.  No increased abdominal pain.  Taking linzess.  Still some bloating, but symptoms overall improved.  Discussed labs.  Discussed low carb diet and exercise.  Blood pressures averaging upper 620B systolic readings.     Past Medical History:  Diagnosis Date   Chest discomfort    Dyspnea on exertion    GERD (gastroesophageal reflux disease)    Hyperlipidemia    Hypertension    Morbid obesity ([place])    Overactive bladder    History reviewed. No pertinent surgical history. Family History  Problem Relation Age of Onset   Prostate cancer Paternal Grandfather        MI @ 69.   Heart disease Paternal Grandfather    Prostate cancer Paternal Uncle    Lung cancer Maternal Grandmother    Hypertension Mother        currently in her 61's   Hypercholesterolemia Mother    Diabetes Mother    Hypertension Father        currently in his 75's   Hypercholesterolemia Father    Heart disease Maternal Grandfather        MI in his 70's   Colon cancer Neg Hx    Social History   Socioeconomic History   Marital status: Single    Spouse name: Not on file   Number of children: Not on file   Years of education: Not on file   Highest education level: Not on file  Occupational History   Occupation: Energy manager: [employer]  Tobacco Use   Smoking status: Never   Smokeless tobacco: Current    Types: Chew  Vaping Use   Vaping Use: Never used  Substance and Sexual Activity   Alcohol use: Yes    Alcohol/week: 0.0 standard drinks of alcohol    Comment: three to four beers a month.   Drug use: No    Sexual activity: Not on file  Other Topics Concern   Not on file  Social History Narrative   Lives in [place] with family.  Single.  No children.  Does routinely exercise - wt training, treadmill.   Social Determinants of Health   Financial Resource Strain: Not on file  Food Insecurity: Not on file  Transportation Needs: Not on file  Physical Activity: Not on file  Stress: Not on file  Social Connections: Not on file     Review of Systems  Constitutional:  Negative for appetite change and unexpected weight change.  HENT:  Negative for congestion, sinus pressure and sore throat.   Eyes:  Negative for pain and visual disturbance.  Respiratory:  Negative for cough, chest tightness and shortness of breath.   Cardiovascular:  Negative for chest pain, palpitations and leg swelling.  Gastrointestinal:  Negative for abdominal pain, diarrhea, nausea and vomiting.  Genitourinary:  Negative for difficulty urinating and dysuria.  Musculoskeletal:  Negative for joint swelling and myalgias.  Skin:  Negative for color change and rash.  Neurological:  Negative for  dizziness, light-headedness and headaches.  Hematological:  Negative for adenopathy. Does not bruise/bleed easily.  Psychiatric/Behavioral:  Negative for agitation and dysphoric mood.        Objective:     BP 136/86   Pulse (!) 103   Temp 97.7 F (36.5 C) (Temporal)   Resp 18   Ht 6' (1.829 m)   Wt (!) 302 lb 6.4 oz (137.2 kg)   SpO2 99%   BMI 41.01 kg/m  Wt Readings from Last 3 Encounters:  03/21/22 (!) 302 lb 6.4 oz (137.2 kg)  11/15/21 300 lb (136.1 kg)  07/12/21 294 lb (133.4 kg)    Physical Exam Constitutional:      General: He is not in acute distress.    Appearance: Normal appearance. He is well-developed.  HENT:     Head: Normocephalic and atraumatic.     Right Ear: External ear normal.     Left Ear: External ear normal.  Eyes:     General: No scleral icterus.       Right eye: No discharge.         Left eye: No discharge.     Conjunctiva/sclera: Conjunctivae normal.  Neck:     Thyroid: No thyromegaly.  Cardiovascular:     Rate and Rhythm: Normal rate and regular rhythm.  Pulmonary:     Effort: No respiratory distress.     Breath sounds: Normal breath sounds. No wheezing.  Abdominal:     General: Bowel sounds are normal.     Palpations: Abdomen is soft.     Tenderness: There is no abdominal tenderness.  Musculoskeletal:        General: No swelling or tenderness.     Cervical back: Neck supple. No tenderness.  Lymphadenopathy:     Cervical: No cervical adenopathy.  Skin:    Findings: No erythema or rash.  Neurological:     Mental Status: He is alert and oriented to person, place, and time.  Psychiatric:        Mood and Affect: Mood normal.        Behavior: Behavior normal.      Outpatient Encounter Medications as of 03/21/2022  Medication Sig   amLODipine (NORVASC) 5 MG tablet Take 1 tablet (5 mg total) by mouth daily.   atorvastatin (LIPITOR) 10 MG tablet Take 1 tablet (10 mg total) by mouth daily.   Azelastine-Fluticasone 137-50 MCG/ACT SUSP SHAKE LIQUID AND USE 1 SPRAY IN EACH NOSTRIL TWICE DAILY   LINZESS 145 MCG CAPS capsule Take 145 mcg by mouth once.   lisinopril (ZESTRIL) 20 MG tablet Take 1 tablet (20 mg total) by mouth daily.   Multiple Vitamin (MULTIVITAMIN) tablet Take 1 tablet by mouth daily.   No facility-administered encounter medications on file as of 03/21/2022.     Lab Results  Component Value Date   WBC 8.9 06/07/2021   HGB 15.5 06/07/2021   HCT 46.5 06/07/2021   PLT 364.0 06/07/2021   GLUCOSE 88 03/17/2022   CHOL 170 03/17/2022   TRIG 286.0 (H) 03/17/2022   HDL 50.90 03/17/2022   LDLDIRECT 93.0 03/17/2022   LDLCALC 87 10/11/2021   ALT 49 12/27/2021   AST 29 12/27/2021   NA 139 03/17/2022   K 4.2 03/17/2022   CL 99 03/17/2022   CREATININE 0.86 03/17/2022   BUN 12 03/17/2022   CO2 28 03/17/2022   TSH 1.94 10/11/2021   PSA 0.19  03/17/2022   HGBA1C 6.3 03/17/2022    US Abdomen Complete  Result  Date: 11/24/2021 CLINICAL DATA:  Abnormal LFTs EXAM: ABDOMEN ULTRASOUND COMPLETE COMPARISON:  02/03/2009 FINDINGS: Gallbladder: No gallstones or wall thickening visualized. No sonographic Murphy sign noted by sonographer. Common bile duct: Diameter: 3 mm Liver: No focal lesion identified. Within normal limits in parenchymal echogenicity. Portal vein is patent on color Doppler imaging with normal direction of blood flow towards the liver. IVC: No abnormality visualized. Pancreas: Visualized portion unremarkable. Spleen: Size and appearance within normal limits. Right Kidney: Length: 12.9 cm. Echogenicity within normal limits. No mass or hydronephrosis visualized. Left Kidney: Length: 13.8 cm. Echogenicity within normal limits. No mass or hydronephrosis visualized. Abdominal aorta: No aneurysm visualized. Other findings: None. IMPRESSION: Normal abdominal ultrasound. Electronically Signed   By: Miachel Roux M.D.   On: 11/24/2021 18:20       Assessment & Plan:   Problem List Items Addressed This Visit     Abnormal liver function test    Last liver panel wnl.        GERD (gastroesophageal reflux disease)    No acid reflux reported.  Continue protonix.        Health care maintenance    Physical today 03/21/22.  PSA 03/17/22 - .19      Hypercholesterolemia    Continue lipitor.  Low cholesterol diet and exercise. [facility] lipid diet. Follow lipid panel and liver function tests.        Relevant Medications   amLODipine (NORVASC) 5 MG tablet   Hyperglycemia    Low carb diet and exercise.  Follow met b and a1c.       Hypertension, essential    Continue lisinopril.  Blood pressure as outlined.  Add amlodipine  70m q day.  Follow pressures.  Follow metabolic panel.       Relevant Medications   amLODipine (NORVASC) 5 MG tablet   Sleep apnea    CPAP.       Thrombocytosis    Follow cbc.       Other Visit Diagnoses      Routine general medical examination at a health care facility    -  Primary        CEinar Pheasant MD

## 2022-03-22 ENCOUNTER — Encounter: Payer: Self-pay | Admitting: Internal Medicine

## 2022-03-22 ENCOUNTER — Telehealth: Payer: Self-pay | Admitting: Internal Medicine

## 2022-03-22 NOTE — Assessment & Plan Note (Signed)
No acid reflux reported.  Continue protonix.  

## 2022-03-22 NOTE — Assessment & Plan Note (Signed)
Follow cbc.  

## 2022-03-22 NOTE — Assessment & Plan Note (Signed)
CPAP.  

## 2022-03-22 NOTE — Assessment & Plan Note (Signed)
Low carb diet and exercise.  Follow met b and a1c.  

## 2022-03-22 NOTE — Assessment & Plan Note (Addendum)
Continue lisinopril.  Blood pressure as outlined.  Add amlodipine  5mg  q day.  Follow pressures.  Follow metabolic panel.

## 2022-03-22 NOTE — Telephone Encounter (Signed)
Please send him [facility] Lipid diet.  Forgot to give to him.

## 2022-03-22 NOTE — Assessment & Plan Note (Addendum)
Continue lipitor.  Low cholesterol diet and exercise. [facility] lipid diet. Follow lipid panel and liver function tests.   

## 2022-03-22 NOTE — Assessment & Plan Note (Signed)
Last liver panel wnl.  

## 2022-03-24 NOTE — Telephone Encounter (Signed)
mailed

## 2022-05-09 ENCOUNTER — Ambulatory Visit: Payer: BC Managed Care – PPO | Admitting: Internal Medicine

## 2022-06-19 ENCOUNTER — Other Ambulatory Visit: Payer: Self-pay | Admitting: *Deleted

## 2022-06-19 MED ORDER — AMLODIPINE BESYLATE 5 MG PO TABS: 5.0000 mg | ORAL_TABLET | Freq: Every day | ORAL | 2 refills | Status: DC

## 2022-06-26 ENCOUNTER — Encounter: Payer: Self-pay | Admitting: Internal Medicine

## 2022-06-27 ENCOUNTER — Telehealth: Payer: BC Managed Care – PPO | Admitting: Internal Medicine

## 2022-06-27 ENCOUNTER — Encounter: Payer: Self-pay | Admitting: Internal Medicine

## 2022-06-27 VITALS — BP 120/70 | Ht 72.0 in | Wt 302.4 lb

## 2022-06-27 DIAGNOSIS — R739 Hyperglycemia, unspecified: Secondary | ICD-10-CM | POA: Diagnosis not present

## 2022-06-27 DIAGNOSIS — G4730 Sleep apnea, unspecified: Secondary | ICD-10-CM

## 2022-06-27 DIAGNOSIS — E7800 Pure hypercholesterolemia, unspecified: Secondary | ICD-10-CM

## 2022-06-27 DIAGNOSIS — I10 Essential (primary) hypertension: Secondary | ICD-10-CM

## 2022-06-27 DIAGNOSIS — R0981 Nasal congestion: Secondary | ICD-10-CM

## 2022-06-27 NOTE — Telephone Encounter (Signed)
Message sent to pt for update.   

## 2022-06-27 NOTE — Progress Notes (Unsigned)
Patient ID: Gary Fleming, male   DOB: 10/29/1980, 41 y.o.   MRN: [id number]   Virtual Visit via video Note  All issues noted in this document were discussed and addressed.  No physical exam was performed (except for noted visual exam findings with Video Visits).   I connected with Jimmey Paisley by a video enabled telemedicine application and verified that I am speaking with the correct person using two identifiers. Location patient: home Location provider: work  Persons participating in the virtual visit: patient, provider  The limitations, risks, security and privacy concerns of performing an evaluation and management service by video and the availability of in person appointments have been discussed.  It has also been discussed with the patient that there may be a patient responsible charge related to this service. The patient expressed understanding and agreed to proceed.   Reason for visit: follow up appt  HPI: Follow up regarding his blood pressure.  Reports also having some increased congestion.  States symptoms started Wednesday - 06/25/22 pm.  Reports head stopped up and increased sinus drainage.  Felt feverish.  Has checked temperature - no fever.  Some headache.  Increased sinus pressure.  Some green mucus production at times.  No cough.  No chest pain, chest tightness or sob.  No nausea or vomiting.  No diarrhea.  Has been taking dayquil, nyquil and started afrin nasal spray.  Blood pressures averaging 120s/70s.     ROS: See pertinent positives and negatives per HPI.  Past Medical History:  Diagnosis Date   Chest discomfort    Dyspnea on exertion    GERD (gastroesophageal reflux disease)    Hyperlipidemia    Hypertension    Morbid obesity ([facility])    Overactive bladder     History reviewed. No pertinent surgical history.  Family History  Problem Relation Age of Onset   Prostate cancer Paternal Grandfather        MI @ 40.   Heart disease Paternal Grandfather     Prostate cancer Paternal Uncle    Lung cancer Maternal Grandmother    Hypertension Mother        currently in her 50's   Hypercholesterolemia Mother    Diabetes Mother    Hypertension Father        currently in his 50's   Hypercholesterolemia Father    Heart disease Maternal Grandfather        MI in his 70's   Colon cancer Neg Hx     SOCIAL HX: reviewed.    Current Outpatient Medications:    amLODipine (NORVASC) 5 MG tablet, Take 1 tablet (5 mg total) by mouth daily., Disp: 30 tablet, Rfl: 2   atorvastatin (LIPITOR) 10 MG tablet, Take 1 tablet (10 mg total) by mouth daily., Disp: 90 tablet, Rfl: 3   Azelastine-Fluticasone 137-50 MCG/ACT SUSP, SHAKE LIQUID AND USE 1 SPRAY IN EACH NOSTRIL TWICE DAILY, Disp: 23 g, Rfl: 3   LINZESS 145 MCG CAPS capsule, Take 145 mcg by mouth once., Disp: , Rfl:    lisinopril (ZESTRIL) 20 MG tablet, Take 1 tablet (20 mg total) by mouth daily., Disp: 90 tablet, Rfl: 3   Multiple Vitamin (MULTIVITAMIN) tablet, Take 1 tablet by mouth daily., Disp: , Rfl:   EXAM:  GENERAL: alert, oriented, appears well and in no acute distress  HEENT: atraumatic, conjunttiva clear, no obvious abnormalities on inspection of external nose and ears  NECK: normal movements of the head and neck  LUNGS: on inspection no signs   of respiratory distress, breathing rate appears normal, no obvious gross SOB, gasping or wheezing  CV: no obvious cyanosis  PSYCH/NEURO: pleasant and cooperative, no obvious depression or anxiety, speech and thought processing grossly intact  ASSESSMENT AND PLAN:  Discussed the following assessment and plan:  Problem List Items Addressed This Visit     Hypercholesterolemia    Continue lipitor.  Low cholesterol diet and exercise. [facility] lipid diet. Follow lipid panel and liver function tests.        Hyperglycemia    Low carb diet and exercise.  Follow met b and a1c.       Hypertension, essential - Primary    Continue lisinopril.  Added  amlodipine  5mg q day last visit.  Reported blood pressures doing well. Follow pressures.  Follow metabolic panel.       Nasal congestion    Increased nasal congestion and sinus pressure along with drainage as outlined.  No cough or sob.  No chest pain or tightness.  Continue afrin nasal spray for three more days.  Add saline nasal srapy and steroid nasal spray as directed.  Covid test negative.  Will check in the next 24-48 hours.  Discussed quarantine guidelines until retesting.  Hold abx.  Follow closely.  Call with update.       Sleep apnea    CPAP       Return in about 2 months (around 08/27/2022) for follow up appt (30min).   I discussed the assessment and treatment plan with the patient. The patient was provided an opportunity to ask questions and all were answered. The patient agreed with the plan and demonstrated an understanding of the instructions.   The patient was advised to call back or seek an in-person evaluation if the symptoms worsen or if the condition fails to improve as anticipated.    Charlene Scott, MD   

## 2022-06-28 ENCOUNTER — Encounter: Payer: Self-pay | Admitting: Internal Medicine

## 2022-06-28 NOTE — Assessment & Plan Note (Signed)
Continue lipitor.  Low cholesterol diet and exercise. [facility] lipid diet. Follow lipid panel and liver function tests.   

## 2022-06-28 NOTE — Assessment & Plan Note (Signed)
CPAP.  

## 2022-06-28 NOTE — Assessment & Plan Note (Signed)
Continue lisinopril.  Added amlodipine  5mg  q day last visit.  Reported blood pressures doing well. Follow pressures.  Follow metabolic panel.

## 2022-06-28 NOTE — Assessment & Plan Note (Signed)
Increased nasal congestion and sinus pressure along with drainage as outlined.  No cough or sob.  No chest pain or tightness.  Continue afrin nasal spray for three more days.  Add saline nasal srapy and steroid nasal spray as directed.  Covid test negative.  Will check in the next 24-48 hours.  Discussed quarantine guidelines until retesting.  Hold abx.  Follow closely.  Call with update.

## 2022-06-28 NOTE — Assessment & Plan Note (Signed)
Low carb diet and exercise.  Follow met b and a1c.  

## 2022-09-16 ENCOUNTER — Encounter: Payer: Self-pay | Admitting: Internal Medicine

## 2022-09-16 ENCOUNTER — Ambulatory Visit: Payer: BC Managed Care – PPO | Admitting: Internal Medicine

## 2022-09-16 VITALS — BP 122/76 | HR 89 | Temp 97.9°F | Resp 16 | Ht 71.0 in | Wt 290.0 lb

## 2022-09-16 DIAGNOSIS — K219 Gastro-esophageal reflux disease without esophagitis: Secondary | ICD-10-CM

## 2022-09-16 DIAGNOSIS — R739 Hyperglycemia, unspecified: Secondary | ICD-10-CM

## 2022-09-16 DIAGNOSIS — I10 Essential (primary) hypertension: Secondary | ICD-10-CM

## 2022-09-16 DIAGNOSIS — E7800 Pure hypercholesterolemia, unspecified: Secondary | ICD-10-CM

## 2022-09-16 DIAGNOSIS — N529 Male erectile dysfunction, unspecified: Secondary | ICD-10-CM

## 2022-09-16 DIAGNOSIS — G4730 Sleep apnea, unspecified: Secondary | ICD-10-CM

## 2022-09-16 DIAGNOSIS — D75839 Thrombocytosis, unspecified: Secondary | ICD-10-CM

## 2022-09-16 LAB — CBC WITH DIFFERENTIAL/PLATELET
Basophils Absolute: 0.1 10*3/uL (ref 0.0–0.1)
Basophils Relative: 0.6 % (ref 0.0–3.0)
Eosinophils Absolute: 0.3 10*3/uL (ref 0.0–0.7)
Eosinophils Relative: 2.8 % (ref 0.0–5.0)
HCT: 45.4 % (ref 39.0–52.0)
Hemoglobin: 15.4 g/dL (ref 13.0–17.0)
Lymphocytes Relative: 19.9 % (ref 12.0–46.0)
Lymphs Abs: 1.9 10*3/uL (ref 0.7–4.0)
MCHC: 33.9 g/dL (ref 30.0–36.0)
MCV: 86.8 fl (ref 78.0–100.0)
Monocytes Absolute: 0.8 10*3/uL (ref 0.1–1.0)
Monocytes Relative: 8.5 % (ref 3.0–12.0)
Neutro Abs: 6.6 10*3/uL (ref 1.4–7.7)
Neutrophils Relative %: 68.2 % (ref 43.0–77.0)
Platelets: 425 10*3/uL — ABNORMAL HIGH (ref 150.0–400.0)
RBC: 5.23 Mil/uL (ref 4.22–5.81)
RDW: 13.3 % (ref 11.5–15.5)
WBC: 9.7 10*3/uL (ref 4.0–10.5)

## 2022-09-16 LAB — BASIC METABOLIC PANEL
BUN: 8 mg/dL (ref 6–23)
CO2: 29 mEq/L (ref 19–32)
Calcium: 9.7 mg/dL (ref 8.4–10.5)
Chloride: 96 mEq/L (ref 96–112)
Creatinine, Ser: 0.79 mg/dL (ref 0.40–1.50)
GFR: 110.48 mL/min (ref >60.00)
Glucose, Bld: 119 mg/dL — ABNORMAL HIGH (ref 70–99)
Potassium: 3.9 mEq/L (ref 3.5–5.1)
Sodium: 135 mEq/L (ref 135–145)

## 2022-09-16 LAB — HEPATIC FUNCTION PANEL
ALT: 42 U/L (ref 0–53)
AST: 26 U/L (ref 0–37)
Albumin: 4.9 g/dL (ref 3.5–5.2)
Alkaline Phosphatase: 109 U/L (ref 39–117)
Bilirubin, Direct: 0.2 mg/dL (ref 0.0–0.3)
Total Bilirubin: 0.8 mg/dL (ref 0.2–1.2)
Total Protein: 8 g/dL (ref 6.0–8.3)

## 2022-09-16 LAB — LIPID PANEL
Cholesterol: 165 mg/dL (ref 0–200)
HDL: 59.9 mg/dL (ref >39.00)
LDL Cholesterol: 83 mg/dL (ref 0–99)
NonHDL: 104.78
Total CHOL/HDL Ratio: 3
Triglycerides: 109 mg/dL (ref 0.0–149.0)
VLDL: 21.8 mg/dL (ref 0.0–40.0)

## 2022-09-16 LAB — TSH: TSH: 2.78 u[IU]/mL (ref 0.35–5.50)

## 2022-09-16 LAB — HEMOGLOBIN A1C: Hgb A1c MFr Bld: 6.2 % (ref 4.6–6.5)

## 2022-09-16 MED ORDER — AMLODIPINE BESYLATE 5 MG PO TABS: 5.0000 mg | ORAL_TABLET | Freq: Every day | ORAL | 3 refills | Status: DC

## 2022-09-16 MED ORDER — LISINOPRIL 20 MG PO TABS: 20.0000 mg | ORAL_TABLET | Freq: Every day | ORAL | 3 refills | Status: DC

## 2022-09-16 MED ORDER — ATORVASTATIN CALCIUM 10 MG PO TABS: 10.0000 mg | ORAL_TABLET | Freq: Every day | ORAL | 3 refills | Status: DC

## 2022-09-16 MED ORDER — SILDENAFIL CITRATE 20 MG PO TABS: ORAL_TABLET | ORAL | 0 refills | Status: AC

## 2022-09-16 NOTE — Progress Notes (Signed)
Subjective:    Patient ID: Gary Fleming, male    DOB: 04-Jun-1981, 42 y.o.   MRN: [id number]  Patient here for  Chief Complaint  Patient presents with   Medical Management of Chronic Issues    HPI Here to follow up regarding his blood pressure and cholesterol.  On lisinopril and amlodipine now for his blood pressure. Working part time job.  More active on that job.  Walking a lot.  No chest pain or sob reported.  No abdominal pain.  Bowels stable.  Blood pressure doing well - averaging 120s/70s.  Request refill sildenafil.  Urology previously prescribed.  Tolerated.  No problems taking.  States would take 3(20mg ) - tablets.   Continue cpap.     Past Medical History:  Diagnosis Date   Chest discomfort    Dyspnea on exertion    GERD (gastroesophageal reflux disease)    Hyperlipidemia    Hypertension    Morbid obesity ([place])    Overactive bladder    History reviewed. No pertinent surgical history. Family History  Problem Relation Age of Onset   Prostate cancer Paternal Grandfather        MI @ 16.   Heart disease Paternal Grandfather    Prostate cancer Paternal Uncle    Lung cancer Maternal Grandmother    Hypertension Mother        currently in her 29's   Hypercholesterolemia Mother    Diabetes Mother    Hypertension Father        currently in his 90's   Hypercholesterolemia Father    Heart disease Maternal Grandfather        MI in his 31's   Colon cancer Neg Hx    Social History   Socioeconomic History   Marital status: Single    Spouse name: Not on file   Number of children: Not on file   Years of education: Not on file   Highest education level: Not on file  Occupational History   Occupation: Energy manager: [employer]  Tobacco Use   Smoking status: Never   Smokeless tobacco: Current    Types: Chew  Vaping Use   Vaping Use: Never used  Substance and Sexual Activity   Alcohol use: Yes    Alcohol/week: 0.0 standard drinks of  alcohol    Comment: three to four beers a month.   Drug use: No   Sexual activity: Not on file  Other Topics Concern   Not on file  Social History Narrative   Lives in [place] with family.  Single.  No children.  Does routinely exercise - wt training, treadmill.   Social Determinants of Health   Financial Resource Strain: Not on file  Food Insecurity: Not on file  Transportation Needs: Not on file  Physical Activity: Not on file  Stress: Not on file  Social Connections: Not on file     Review of Systems  Constitutional:  Negative for appetite change and unexpected weight change.  HENT:  Negative for congestion and sinus pressure.   Respiratory:  Negative for cough, chest tightness and shortness of breath.   Cardiovascular:  Negative for chest pain, palpitations and leg swelling.  Gastrointestinal:  Negative for abdominal pain, diarrhea, nausea and vomiting.  Genitourinary:  Negative for difficulty urinating and dysuria.  Musculoskeletal:  Negative for joint swelling and myalgias.  Skin:  Negative for color change and rash.  Neurological:  Negative for dizziness and headaches.  Psychiatric/Behavioral:  Negative for agitation and dysphoric mood.        Objective:     BP 122/76   Pulse 89   Temp 97.9 F (36.6 C)   Resp 16   Ht 5\' 11"  (1.803 m)   Wt 290 lb (131.5 kg)   SpO2 99%   BMI 40.45 kg/m  Wt Readings from Last 3 Encounters:  09/16/22 290 lb (131.5 kg)  06/27/22 (!) 302 lb 6.4 oz (137.2 kg)  03/21/22 (!) 302 lb 6.4 oz (137.2 kg)    Physical Exam Constitutional:      General: He is not in acute distress.    Appearance: Normal appearance. He is well-developed.  HENT:     Head: Normocephalic and atraumatic.     Right Ear: External ear normal.     Left Ear: External ear normal.  Eyes:     General: No scleral icterus.       Right eye: No discharge.        Left eye: No discharge.  Cardiovascular:     Rate and Rhythm: Normal rate and regular rhythm.   Pulmonary:     Effort: Pulmonary effort is normal. No respiratory distress.     Breath sounds: Normal breath sounds.  Abdominal:     General: Bowel sounds are normal.     Palpations: Abdomen is soft.     Tenderness: There is no abdominal tenderness.  Musculoskeletal:        General: No swelling or tenderness.     Cervical back: Neck supple. No tenderness.  Lymphadenopathy:     Cervical: No cervical adenopathy.  Skin:    Findings: No erythema or rash.  Neurological:     Mental Status: He is alert.  Psychiatric:        Mood and Affect: Mood normal.        Behavior: Behavior normal.      Outpatient Encounter Medications as of 09/16/2022  Medication Sig   sildenafil (REVATIO) 20 MG tablet Take 2-3 tablets daily prn   amLODipine (NORVASC) 5 MG tablet Take 1 tablet (5 mg total) by mouth daily.   atorvastatin (LIPITOR) 10 MG tablet Take 1 tablet (10 mg total) by mouth daily.   Azelastine-Fluticasone 137-50 MCG/ACT SUSP SHAKE LIQUID AND USE 1 SPRAY IN EACH NOSTRIL TWICE DAILY   LINZESS 145 MCG CAPS capsule Take 145 mcg by mouth once.   lisinopril (ZESTRIL) 20 MG tablet Take 1 tablet (20 mg total) by mouth daily.   Multiple Vitamin (MULTIVITAMIN) tablet Take 1 tablet by mouth daily.   [DISCONTINUED] amLODipine (NORVASC) 5 MG tablet Take 1 tablet (5 mg total) by mouth daily.   [DISCONTINUED] atorvastatin (LIPITOR) 10 MG tablet Take 1 tablet (10 mg total) by mouth daily.   [DISCONTINUED] lisinopril (ZESTRIL) 20 MG tablet Take 1 tablet (20 mg total) by mouth daily.   No facility-administered encounter medications on file as of 09/16/2022.     Lab Results  Component Value Date   WBC 9.7 09/16/2022   HGB 15.4 09/16/2022   HCT 45.4 09/16/2022   PLT 425.0 (H) 09/16/2022   GLUCOSE 119 (H) 09/16/2022   CHOL 165 09/16/2022   TRIG 109.0 09/16/2022   HDL 59.90 09/16/2022   LDLDIRECT 93.0 03/17/2022   LDLCALC 83 09/16/2022   ALT 42 09/16/2022   AST 26 09/16/2022   NA 135 09/16/2022    K 3.9 09/16/2022   CL 96 09/16/2022   CREATININE 0.79 09/16/2022   BUN 8 09/16/2022  CO2 29 09/16/2022   TSH 2.78 09/16/2022   PSA 0.19 03/17/2022   HGBA1C 6.2 09/16/2022    US Abdomen Complete  Result Date: 11/24/2021 CLINICAL DATA:  Abnormal LFTs EXAM: ABDOMEN ULTRASOUND COMPLETE COMPARISON:  02/03/2009 FINDINGS: Gallbladder: No gallstones or wall thickening visualized. No sonographic Murphy sign noted by sonographer. Common bile duct: Diameter: 3 mm Liver: No focal lesion identified. Within normal limits in parenchymal echogenicity. Portal vein is patent on color Doppler imaging with normal direction of blood flow towards the liver. IVC: No abnormality visualized. Pancreas: Visualized portion unremarkable. Spleen: Size and appearance within normal limits. Right Kidney: Length: 12.9 cm. Echogenicity within normal limits. No mass or hydronephrosis visualized. Left Kidney: Length: 13.8 cm. Echogenicity within normal limits. No mass or hydronephrosis visualized. Abdominal aorta: No aneurysm visualized. Other findings: None. IMPRESSION: Normal abdominal ultrasound. Electronically Signed   By: Acquanetta Belling M.D.   On: 11/24/2021 18:20       Assessment & Plan:  Hypertension, essential Assessment & Plan: Continue lisinopril and amlodipine. Reported blood pressures doing well. Follow pressures.  Follow metabolic panel.   Orders: -     Basic metabolic panel -     CBC with Differential/Platelet -     TSH  Hyperglycemia Assessment & Plan: Low carb diet and exercise.  Follow met b and a1c.   Orders: -     Hemoglobin A1c  Hypercholesterolemia Assessment & Plan: Continue lipitor.  Low cholesterol diet and exercise. [facility] lipid diet. Follow lipid panel and liver function tests.    Orders: -     Hepatic function panel -     Lipid panel  Gastroesophageal reflux disease, unspecified whether esophagitis present Assessment & Plan: No acid reflux reported.  Continue protonix.     Sleep  apnea, unspecified type Assessment & Plan: CPAP   Thrombocytosis Assessment & Plan: Follow cbc.     Erectile dysfunction, unspecified erectile dysfunction type Assessment & Plan: Request refill sildenafil.  Urology has previously prescribed.  Tolerated.  Takes 3(20mg ).  Follow.    Other orders -     amLODIPine Besylate; Take 1 tablet (5 mg total) by mouth daily.  Dispense: 90 tablet; Refill: 3 -     Atorvastatin Calcium; Take 1 tablet (10 mg total) by mouth daily.  Dispense: 90 tablet; Refill: 3 -     Lisinopril; Take 1 tablet (20 mg total) by mouth daily.  Dispense: 90 tablet; Refill: 3 -     Sildenafil Citrate; Take 2-3 tablets daily prn  Dispense: 60 tablet; Refill: 0     Dale Mosses, MD

## 2022-09-20 ENCOUNTER — Encounter: Payer: Self-pay | Admitting: Internal Medicine

## 2022-09-20 DIAGNOSIS — N529 Male erectile dysfunction, unspecified: Secondary | ICD-10-CM | POA: Insufficient documentation

## 2022-09-20 NOTE — Assessment & Plan Note (Signed)
Continue lisinopril and amlodipine. Reported blood pressures doing well. Follow pressures.  Follow metabolic panel.

## 2022-09-20 NOTE — Assessment & Plan Note (Signed)
Continue lipitor.  Low cholesterol diet and exercise. [facility] lipid diet. Follow lipid panel and liver function tests.

## 2022-09-20 NOTE — Assessment & Plan Note (Signed)
Follow cbc.

## 2022-09-20 NOTE — Assessment & Plan Note (Signed)
No acid reflux reported.  Continue protonix.  

## 2022-09-20 NOTE — Assessment & Plan Note (Signed)
Low carb diet and exercise.  Follow met b and a1c.  

## 2022-09-20 NOTE — Assessment & Plan Note (Signed)
Request refill sildenafil.  Urology has previously prescribed.  Tolerated.  Takes 3(20mg ).  Follow.

## 2022-09-20 NOTE — Assessment & Plan Note (Signed)
CPAP.  

## 2022-09-23 ENCOUNTER — Other Ambulatory Visit: Payer: Self-pay

## 2022-09-23 DIAGNOSIS — D75839 Thrombocytosis, unspecified: Secondary | ICD-10-CM

## 2022-10-17 ENCOUNTER — Other Ambulatory Visit (INDEPENDENT_AMBULATORY_CARE_PROVIDER_SITE_OTHER): Payer: BC Managed Care – PPO

## 2022-10-17 DIAGNOSIS — D75839 Thrombocytosis, unspecified: Secondary | ICD-10-CM

## 2022-10-17 LAB — CBC WITH DIFFERENTIAL/PLATELET
Basophils Absolute: 0.1 10*3/uL (ref 0.0–0.1)
Basophils Relative: 0.7 % (ref 0.0–3.0)
Eosinophils Absolute: 0.5 10*3/uL (ref 0.0–0.7)
Eosinophils Relative: 5.3 % — ABNORMAL HIGH (ref 0.0–5.0)
HCT: 45.4 % (ref 39.0–52.0)
Hemoglobin: 15.3 g/dL (ref 13.0–17.0)
Lymphocytes Relative: 22 % (ref 12.0–46.0)
Lymphs Abs: 2.1 10*3/uL (ref 0.7–4.0)
MCHC: 33.7 g/dL (ref 30.0–36.0)
MCV: 87 fl (ref 78.0–100.0)
Monocytes Absolute: 1 10*3/uL (ref 0.1–1.0)
Monocytes Relative: 11 % (ref 3.0–12.0)
Neutro Abs: 5.7 10*3/uL (ref 1.4–7.7)
Neutrophils Relative %: 61 % (ref 43.0–77.0)
Platelets: 374 10*3/uL (ref 150.0–400.0)
RBC: 5.21 Mil/uL (ref 4.22–5.81)
RDW: 12.9 % (ref 11.5–15.5)
WBC: 9.4 10*3/uL (ref 4.0–10.5)

## 2022-10-20 ENCOUNTER — Telehealth: Payer: Self-pay

## 2022-10-20 NOTE — Telephone Encounter (Signed)
Pharmacy Patient Advocate Encounter  Received notification from [facility] that the request for prior authorization for Sildenafil has been denied due to .    Please be advised we currently do not have a Pharmacist to review denials, therefore you will need to process appeals accordingly as needed. Thanks for your support at this time.   You may call [phone] or fax [phone], to appeal.

## 2023-01-16 ENCOUNTER — Ambulatory Visit: Payer: BC Managed Care – PPO | Admitting: Internal Medicine

## 2023-01-16 VITALS — BP 130/84 | HR 92 | Temp 98.6°F | Ht 71.0 in | Wt 297.4 lb

## 2023-01-16 DIAGNOSIS — I10 Essential (primary) hypertension: Secondary | ICD-10-CM | POA: Diagnosis not present

## 2023-01-16 DIAGNOSIS — K219 Gastro-esophageal reflux disease without esophagitis: Secondary | ICD-10-CM | POA: Diagnosis not present

## 2023-01-16 DIAGNOSIS — E7800 Pure hypercholesterolemia, unspecified: Secondary | ICD-10-CM | POA: Diagnosis not present

## 2023-01-16 DIAGNOSIS — R739 Hyperglycemia, unspecified: Secondary | ICD-10-CM

## 2023-01-16 DIAGNOSIS — D75839 Thrombocytosis, unspecified: Secondary | ICD-10-CM

## 2023-01-16 DIAGNOSIS — G4730 Sleep apnea, unspecified: Secondary | ICD-10-CM

## 2023-01-16 NOTE — Progress Notes (Unsigned)
Subjective:    Patient ID: Gary Fleming, male    DOB: February 18, 1981, 42 y.o.   MRN: [id number]  Patient here for  Chief Complaint  Patient presents with   Medical Management of Chronic Issues    HPI Here to follow up regarding his blood pressure and cholesterol. On lisinopril and amlodipine now for his blood pressure. Continues cpap.  CPAP  Past Medical History:  Diagnosis Date   Chest discomfort    Dyspnea on exertion    GERD (gastroesophageal reflux disease)    Hyperlipidemia    Hypertension    Morbid obesity (HCC)    Overactive bladder    No past surgical history on file. Family History  Problem Relation Age of Onset   Prostate cancer Paternal Grandfather        MI @ 39.   Heart disease Paternal Grandfather    Prostate cancer Paternal Uncle    Lung cancer Maternal Grandmother    Hypertension Mother        currently in her 43's   Hypercholesterolemia Mother    Diabetes Mother    Hypertension Father        currently in his 46's   Hypercholesterolemia Father    Heart disease Maternal Grandfather        MI in his 5's   Colon cancer Neg Hx    Social History   Socioeconomic History   Marital status: Single    Spouse name: Not on file   Number of children: Not on file   Years of education: Not on file   Highest education level: Associate degree: academic program  Occupational History   Occupation: Building control surveyor: [employer]  Tobacco Use   Smoking status: Never   Smokeless tobacco: Current    Types: Chew  Vaping Use   Vaping Use: Never used  Substance and Sexual Activity   Alcohol use: Yes    Alcohol/week: 0.0 standard drinks of alcohol    Comment: three to four beers a month.   Drug use: No   Sexual activity: Not on file  Other Topics Concern   Not on file  Social History Narrative   Lives in [place] with family.  Single.  No children.  Does routinely exercise - wt training, treadmill.   Social Determinants of  Health   Financial Resource Strain: Low Risk  (01/16/2023)   Overall Financial Resource Strain (CARDIA)    Difficulty of Paying Living Expenses: Not very hard  Food Insecurity: No Food Insecurity (01/16/2023)   Hunger Vital Sign    Worried About Running Out of Food in the Last Year: Never true    Ran Out of Food in the Last Year: Never true  Transportation Needs: No Transportation Needs (01/16/2023)   PRAPARE - Administrator, Civil Service (Medical): No    Lack of Transportation (Non-Medical): No  Physical Activity: Sufficiently Active (01/16/2023)   Exercise Vital Sign    Days of Exercise per Week: 3 days    Minutes of Exercise per Session: 60 min  Stress: No Stress Concern Present (01/16/2023)   [employer] of Occupational Health - Occupational Stress Questionnaire    Feeling of Stress : Only a little  Social Connections: Moderately Integrated (01/16/2023)   Social Connection and Isolation Panel [NHANES]    Frequency of Communication with Friends and Family: Twice a week    Frequency of Social Gatherings with Friends and Family: Once a week  Attends Religious Services: 1 to 4 times per year    Active Member of Clubs or Organizations: No    Attends Engineer, structural: Not on file    Marital Status: Living with partner     Review of Systems     Objective:     BP 130/84   Pulse 92   Temp 98.6 F (37 C) (Oral)   Ht 5\' 11"  (1.803 m)   Wt 297 lb 6.4 oz (134.9 kg)   SpO2 96%   BMI 41.48 kg/m  Wt Readings from Last 3 Encounters:  01/16/23 297 lb 6.4 oz (134.9 kg)  09/16/22 290 lb (131.5 kg)  06/27/22 (!) 302 lb 6.4 oz (137.2 kg)    Physical Exam   Outpatient Encounter Medications as of 01/16/2023  Medication Sig   amLODipine (NORVASC) 5 MG tablet Take 1 tablet (5 mg total) by mouth daily.   atorvastatin (LIPITOR) 10 MG tablet Take 1 tablet (10 mg total) by mouth daily.   Azelastine-Fluticasone 137-50 MCG/ACT SUSP SHAKE LIQUID AND USE 1  SPRAY IN EACH NOSTRIL TWICE DAILY   LINZESS 145 MCG CAPS capsule Take 145 mcg by mouth once.   lisinopril (ZESTRIL) 20 MG tablet Take 1 tablet (20 mg total) by mouth daily.   Multiple Vitamin (MULTIVITAMIN) tablet Take 1 tablet by mouth daily.   sildenafil (REVATIO) 20 MG tablet Take 2-3 tablets daily prn   No facility-administered encounter medications on file as of 01/16/2023.     Lab Results  Component Value Date   WBC 9.4 10/17/2022   HGB 15.3 10/17/2022   HCT 45.4 10/17/2022   PLT 374.0 10/17/2022   GLUCOSE 119 (H) 09/16/2022   CHOL 165 09/16/2022   TRIG 109.0 09/16/2022   HDL 59.90 09/16/2022   LDLDIRECT 93.0 03/17/2022   LDLCALC 83 09/16/2022   ALT 42 09/16/2022   AST 26 09/16/2022   NA 135 09/16/2022   K 3.9 09/16/2022   CL 96 09/16/2022   CREATININE 0.79 09/16/2022   BUN 8 09/16/2022   CO2 29 09/16/2022   TSH 2.78 09/16/2022   PSA 0.19 03/17/2022   HGBA1C 6.2 09/16/2022    US Abdomen Complete  Result Date: 11/24/2021 CLINICAL DATA:  Abnormal LFTs EXAM: ABDOMEN ULTRASOUND COMPLETE COMPARISON:  02/03/2009 FINDINGS: Gallbladder: No gallstones or wall thickening visualized. No sonographic Murphy sign noted by sonographer. Common bile duct: Diameter: 3 mm Liver: No focal lesion identified. Within normal limits in parenchymal echogenicity. Portal vein is patent on color Doppler imaging with normal direction of blood flow towards the liver. IVC: No abnormality visualized. Pancreas: Visualized portion unremarkable. Spleen: Size and appearance within normal limits. Right Kidney: Length: 12.9 cm. Echogenicity within normal limits. No mass or hydronephrosis visualized. Left Kidney: Length: 13.8 cm. Echogenicity within normal limits. No mass or hydronephrosis visualized. Abdominal aorta: No aneurysm visualized. Other findings: None. IMPRESSION: Normal abdominal ultrasound. Electronically Signed   By: Acquanetta Belling M.D.   On: 11/24/2021 18:20       Assessment & Plan:   Hypercholesterolemia -     Hepatic function panel; Future -     Lipid panel; Future  Hyperglycemia -     Hemoglobin A1c; Future  Hypertension, essential -     Basic metabolic panel; Future     Dale Inger, MD

## 2023-01-17 ENCOUNTER — Encounter: Payer: Self-pay | Admitting: Internal Medicine

## 2023-01-17 ENCOUNTER — Telehealth: Payer: Self-pay | Admitting: Internal Medicine

## 2023-01-17 NOTE — Assessment & Plan Note (Signed)
Low carb diet and exercise.  Follow met b and a1c.   

## 2023-01-17 NOTE — Assessment & Plan Note (Signed)
Wearing cpap regularly.  Has concerns regarding the fit of his mask.  Contact his sleep company regarding change in mask, etc.

## 2023-01-17 NOTE — Telephone Encounter (Signed)
He has sleep apnea.  Wearing cpap.  (Through [facility] he thinks).  He is having problems with his mask - fitting.  Need to see if able to get a new mask.  Last sleep study appears to be a home sleep study - 2019?Marland Kitchen

## 2023-01-17 NOTE — Assessment & Plan Note (Signed)
Continue lipitor.  Low cholesterol diet and exercise. [facility] lipid diet. Follow lipid panel and liver function tests.   

## 2023-01-17 NOTE — Assessment & Plan Note (Signed)
No acid reflux reported.  Continue protonix.  

## 2023-01-17 NOTE — Assessment & Plan Note (Signed)
Continue lisinopril and amlodipine. Reported blood pressures doing well. Follow pressures.  Follow metabolic panel.  

## 2023-01-17 NOTE — Assessment & Plan Note (Signed)
Platelet count wnl - 09/2022.

## 2023-01-27 NOTE — Telephone Encounter (Signed)
Spoke with Fayrene Fearing at [facility] and was advised that patient needs to call and speak with one of the resp therapist at [facility]. Provided number to patient and advised to let us know if he needs anything.

## 2023-02-06 ENCOUNTER — Other Ambulatory Visit: Payer: BC Managed Care – PPO

## 2023-02-13 ENCOUNTER — Other Ambulatory Visit (INDEPENDENT_AMBULATORY_CARE_PROVIDER_SITE_OTHER): Payer: BC Managed Care – PPO

## 2023-02-13 DIAGNOSIS — E7800 Pure hypercholesterolemia, unspecified: Secondary | ICD-10-CM | POA: Diagnosis not present

## 2023-02-13 DIAGNOSIS — I10 Essential (primary) hypertension: Secondary | ICD-10-CM

## 2023-02-13 DIAGNOSIS — R739 Hyperglycemia, unspecified: Secondary | ICD-10-CM | POA: Diagnosis not present

## 2023-02-13 LAB — BASIC METABOLIC PANEL
BUN: 10 mg/dL (ref 6–23)
CO2: 31 mEq/L (ref 19–32)
Calcium: 9.2 mg/dL (ref 8.4–10.5)
Chloride: 98 mEq/L (ref 96–112)
Creatinine, Ser: 0.84 mg/dL (ref 0.40–1.50)
GFR: 108.14 mL/min (ref >60.00)
Glucose, Bld: 109 mg/dL — ABNORMAL HIGH (ref 70–99)
Potassium: 4.7 mEq/L (ref 3.5–5.1)
Sodium: 136 mEq/L (ref 135–145)

## 2023-02-13 LAB — LIPID PANEL
Cholesterol: 171 mg/dL (ref 0–200)
HDL: 54.1 mg/dL (ref >39.00)
LDL Cholesterol: 92 mg/dL (ref 0–99)
NonHDL: 116.78
Total CHOL/HDL Ratio: 3
Triglycerides: 126 mg/dL (ref 0.0–149.0)
VLDL: 25.2 mg/dL (ref 0.0–40.0)

## 2023-02-13 LAB — HEPATIC FUNCTION PANEL
ALT: 39 U/L (ref 0–53)
AST: 22 U/L (ref 0–37)
Albumin: 4.5 g/dL (ref 3.5–5.2)
Alkaline Phosphatase: 88 U/L (ref 39–117)
Bilirubin, Direct: 0.1 mg/dL (ref 0.0–0.3)
Total Bilirubin: 0.6 mg/dL (ref 0.2–1.2)
Total Protein: 7.3 g/dL (ref 6.0–8.3)

## 2023-02-13 LAB — HEMOGLOBIN A1C: Hgb A1c MFr Bld: 6 % (ref 4.6–6.5)

## 2023-03-02 ENCOUNTER — Encounter: Payer: Self-pay | Admitting: Internal Medicine

## 2023-04-17 ENCOUNTER — Encounter: Payer: BC Managed Care – PPO | Admitting: Internal Medicine

## 2023-05-19 ENCOUNTER — Encounter: Payer: Self-pay | Admitting: Internal Medicine

## 2023-05-20 NOTE — Telephone Encounter (Signed)
LMTCB

## 2023-05-20 NOTE — Telephone Encounter (Signed)
Patient returned office phone call. He is feeling better today, no fever.

## 2023-05-21 NOTE — Telephone Encounter (Signed)
Noted  

## 2023-06-23 ENCOUNTER — Ambulatory Visit: Payer: BC Managed Care – PPO | Admitting: Internal Medicine

## 2023-07-17 ENCOUNTER — Ambulatory Visit: Payer: BC Managed Care – PPO | Admitting: Internal Medicine

## 2023-07-17 ENCOUNTER — Encounter: Payer: Self-pay | Admitting: Internal Medicine

## 2023-07-17 VITALS — BP 128/70 | HR 96 | Temp 98.2°F | Resp 16 | Ht 71.0 in | Wt 308.0 lb

## 2023-07-17 DIAGNOSIS — I10 Essential (primary) hypertension: Secondary | ICD-10-CM | POA: Diagnosis not present

## 2023-07-17 DIAGNOSIS — R739 Hyperglycemia, unspecified: Secondary | ICD-10-CM | POA: Diagnosis not present

## 2023-07-17 DIAGNOSIS — E7800 Pure hypercholesterolemia, unspecified: Secondary | ICD-10-CM

## 2023-07-17 DIAGNOSIS — Z23 Encounter for immunization: Secondary | ICD-10-CM

## 2023-07-17 DIAGNOSIS — K219 Gastro-esophageal reflux disease without esophagitis: Secondary | ICD-10-CM

## 2023-07-17 DIAGNOSIS — Z125 Encounter for screening for malignant neoplasm of prostate: Secondary | ICD-10-CM

## 2023-07-17 DIAGNOSIS — K649 Unspecified hemorrhoids: Secondary | ICD-10-CM

## 2023-07-17 DIAGNOSIS — G4730 Sleep apnea, unspecified: Secondary | ICD-10-CM

## 2023-07-17 MED ORDER — AMLODIPINE BESYLATE 5 MG PO TABS: 5.0000 mg | ORAL_TABLET | Freq: Two times a day (BID) | ORAL | 1 refills | Status: DC

## 2023-07-17 NOTE — Progress Notes (Unsigned)
Subjective:    Patient ID: Gary Fleming, male    DOB: 1980-10-13, 42 y.o.   MRN: [id number]  Patient here for  Chief Complaint  Patient presents with   Medical Management of Chronic Issues    HPI Here to follow up regarding his blood pressure and cholesterol. On lisinopril and amlodipine now for his blood pressure. Has sleep apnea - CPAP. Reports blood pressures are remaining 130s/80s.  Discussed goal blood pressure. Feels the amlodipine does more to lower his pressure. No chest pain or sob reported. No cough or congestion. Does report some issues with hemorrhoids. When hemorrhoids flare - feels affects bladder function. Linzess - helps.  No flare currently.    Past Medical History:  Diagnosis Date   Chest discomfort    Dyspnea on exertion    GERD (gastroesophageal reflux disease)    Hyperlipidemia    Hypertension    Morbid obesity (HCC)    Overactive bladder    History reviewed. No pertinent surgical history. Family History  Problem Relation Age of Onset   Prostate cancer Paternal Grandfather        MI @ 73.   Heart disease Paternal Grandfather    Prostate cancer Paternal Uncle    Lung cancer Maternal Grandmother    Hypertension Mother        currently in her 15's   Hypercholesterolemia Mother    Diabetes Mother    Hypertension Father        currently in his 13's   Hypercholesterolemia Father    Heart disease Maternal Grandfather        MI in his 3's   Colon cancer Neg Hx    Social History   Socioeconomic History   Marital status: Single    Spouse name: Not on file   Number of children: Not on file   Years of education: Not on file   Highest education level: Associate degree: academic program  Occupational History   Occupation: Building control surveyor: [employer]  Tobacco Use   Smoking status: Never   Smokeless tobacco: Current    Types: Chew  Vaping Use   Vaping status: Never Used  Substance and Sexual Activity   Alcohol use:  Yes    Alcohol/week: 0.0 standard drinks of alcohol    Comment: three to four beers a month.   Drug use: No   Sexual activity: Not on file  Other Topics Concern   Not on file  Social History Narrative   Lives in [place] with family.  Single.  No children.  Does routinely exercise - wt training, treadmill.   Social Determinants of Health   Financial Resource Strain: Low Risk  (01/16/2023)   Overall Financial Resource Strain (CARDIA)    Difficulty of Paying Living Expenses: Not very hard  Food Insecurity: No Food Insecurity (01/16/2023)   Hunger Vital Sign    Worried About Running Out of Food in the Last Year: Never true    Ran Out of Food in the Last Year: Never true  Transportation Needs: No Transportation Needs (01/16/2023)   PRAPARE - Administrator, Civil Service (Medical): No    Lack of Transportation (Non-Medical): No  Physical Activity: Sufficiently Active (01/16/2023)   Exercise Vital Sign    Days of Exercise per Week: 3 days    Minutes of Exercise per Session: 60 min  Stress: No Stress Concern Present (01/16/2023)   [employer] of Occupational Health - Occupational  Stress Questionnaire    Feeling of Stress : Only a little  Social Connections: Moderately Integrated (01/16/2023)   Social Connection and Isolation Panel [NHANES]    Frequency of Communication with Friends and Family: Twice a week    Frequency of Social Gatherings with Friends and Family: Once a week    Attends Religious Services: 1 to 4 times per year    Active Member of [employer] or Organizations: No    Attends Engineer, structural: Not on file    Marital Status: Living with partner     Review of Systems  Constitutional:  Negative for appetite change and unexpected weight change.  HENT:  Negative for congestion and sinus pressure.   Respiratory:  Negative for cough, chest tightness and shortness of breath.   Cardiovascular:  Negative for chest pain and palpitations.   Gastrointestinal:  Negative for abdominal pain, nausea and vomiting.  Genitourinary:  Negative for difficulty urinating and dysuria.  Musculoskeletal:  Negative for joint swelling and myalgias.  Skin:  Negative for color change and rash.  Neurological:  Negative for dizziness and headaches.  Psychiatric/Behavioral:  Negative for agitation and dysphoric mood.        Objective:     BP 128/70   Pulse 96   Temp 98.2 F (36.8 C)   Resp 16   Ht 5\' 11"  (1.803 m)   Wt (!) 308 lb (139.7 kg)   SpO2 97%   BMI 42.96 kg/m  Wt Readings from Last 3 Encounters:  07/17/23 (!) 308 lb (139.7 kg)  01/16/23 297 lb 6.4 oz (134.9 kg)  09/16/22 290 lb (131.5 kg)    Physical Exam Vitals reviewed.  Constitutional:      General: He is not in acute distress.    Appearance: Normal appearance. He is well-developed.  HENT:     Head: Normocephalic and atraumatic.     Right Ear: External ear normal.     Left Ear: External ear normal.  Eyes:     General: No scleral icterus.       Right eye: No discharge.        Left eye: No discharge.     Conjunctiva/sclera: Conjunctivae normal.  Cardiovascular:     Rate and Rhythm: Normal rate and regular rhythm.  Pulmonary:     Effort: Pulmonary effort is normal. No respiratory distress.     Breath sounds: Normal breath sounds.  Abdominal:     General: Bowel sounds are normal.     Palpations: Abdomen is soft.     Tenderness: There is no abdominal tenderness.  Genitourinary:    Comments: Did not feel needed rectal exam today. No flare currently Musculoskeletal:        General: No swelling or tenderness.     Cervical back: Neck supple. No tenderness.  Lymphadenopathy:     Cervical: No cervical adenopathy.  Skin:    Findings: No erythema or rash.  Neurological:     Mental Status: He is alert.  Psychiatric:        Mood and Affect: Mood normal.        Behavior: Behavior normal.      Outpatient Encounter Medications as of 07/17/2023  Medication Sig    amLODipine (NORVASC) 5 MG tablet Take 1 tablet (5 mg total) by mouth 2 (two) times daily.   atorvastatin (LIPITOR) 10 MG tablet Take 1 tablet (10 mg total) by mouth daily.   Azelastine-Fluticasone 137-50 MCG/ACT SUSP SHAKE LIQUID AND USE 1 SPRAY IN [facility]  NOSTRIL TWICE DAILY   LINZESS 145 MCG CAPS capsule Take 145 mcg by mouth once.   lisinopril (ZESTRIL) 20 MG tablet Take 1 tablet (20 mg total) by mouth daily.   Multiple Vitamin (MULTIVITAMIN) tablet Take 1 tablet by mouth daily.   sildenafil (REVATIO) 20 MG tablet Take 2-3 tablets daily prn   [DISCONTINUED] amLODipine (NORVASC) 5 MG tablet Take 1 tablet (5 mg total) by mouth daily.   No facility-administered encounter medications on file as of 07/17/2023.     Lab Results  Component Value Date   WBC 9.4 10/17/2022   HGB 15.3 10/17/2022   HCT 45.4 10/17/2022   PLT 374.0 10/17/2022   GLUCOSE 109 (H) 02/13/2023   CHOL 171 02/13/2023   TRIG 126.0 02/13/2023   HDL 54.10 02/13/2023   LDLDIRECT 93.0 03/17/2022   LDLCALC 92 02/13/2023   ALT 39 02/13/2023   AST 22 02/13/2023   NA 136 02/13/2023   K 4.7 02/13/2023   CL 98 02/13/2023   CREATININE 0.84 02/13/2023   BUN 10 02/13/2023   CO2 31 02/13/2023   TSH 2.78 09/16/2022   PSA 0.19 03/17/2022   HGBA1C 6.0 02/13/2023    US Abdomen Complete  Result Date: 11/24/2021 CLINICAL DATA:  Abnormal LFTs EXAM: ABDOMEN ULTRASOUND COMPLETE COMPARISON:  02/03/2009 FINDINGS: Gallbladder: No gallstones or wall thickening visualized. No sonographic Murphy sign noted by sonographer. Common bile duct: Diameter: 3 mm Liver: No focal lesion identified. Within normal limits in parenchymal echogenicity. Portal vein is patent on color Doppler imaging with normal direction of blood flow towards the liver. IVC: No abnormality visualized. Pancreas: Visualized portion unremarkable. Spleen: Size and appearance within normal limits. Right Kidney: Length: 12.9 cm. Echogenicity within normal limits. No mass or  hydronephrosis visualized. Left Kidney: Length: 13.8 cm. Echogenicity within normal limits. No mass or hydronephrosis visualized. Abdominal aorta: No aneurysm visualized. Other findings: None. IMPRESSION: Normal abdominal ultrasound. Electronically Signed   By: Acquanetta Belling M.D.   On: 11/24/2021 18:20       Assessment & Plan:  Hypertension, essential Assessment & Plan: On lisinopril and amlodipine. Will increased amlodipine to 5mg  bid. Follow pressures.  Follow metabolic panel.   Orders: -     Basic metabolic panel; Future  Hyperglycemia Assessment & Plan: Low carb diet and exercise.  Follow met b and a1c.   Orders: -     Hemoglobin A1c; Future  Hypercholesterolemia Assessment & Plan: Continue lipitor.  Low cholesterol diet and exercise. [facility] lipid diet. Follow lipid panel and liver function tests.    Orders: -     Hepatic function panel; Future -     Lipid panel; Future -     TSH; Future  Prostate cancer screening -     PSA; Future  Need for influenza vaccination -     Flu vaccine trivalent PF, 6mos and older(Flulaval,Afluria,Fluarix,Fluzone)  Sleep apnea, unspecified type Assessment & Plan: Cpap.    Gastroesophageal reflux disease, unspecified whether esophagitis present Assessment & Plan: No acid reflux reported.  Continue protonix.     Hemorrhoids, unspecified hemorrhoid type Assessment & Plan: Reports flare intermittently. Linzess to help bowels.  Stay hydrated. Exercise.  Keep bowels moving.    Other orders -     amLODIPine Besylate; Take 1 tablet (5 mg total) by mouth 2 (two) times daily.  Dispense: 180 tablet; Refill: 1     Dale [place], MD

## 2023-07-19 ENCOUNTER — Encounter: Payer: Self-pay | Admitting: Internal Medicine

## 2023-07-19 DIAGNOSIS — K649 Unspecified hemorrhoids: Secondary | ICD-10-CM | POA: Insufficient documentation

## 2023-07-19 NOTE — Assessment & Plan Note (Signed)
Continue lipitor.  Low cholesterol diet and exercise. [facility] lipid diet. Follow lipid panel and liver function tests.

## 2023-07-19 NOTE — Assessment & Plan Note (Signed)
Cpap

## 2023-07-19 NOTE — Assessment & Plan Note (Signed)
Reports flare intermittently. Linzess to help bowels.  Stay hydrated. Exercise.  Keep bowels moving.

## 2023-07-19 NOTE — Assessment & Plan Note (Signed)
Low carb diet and exercise.  Follow met b and a1c.   

## 2023-07-19 NOTE — Assessment & Plan Note (Signed)
No acid reflux reported.  Continue protonix.

## 2023-07-19 NOTE — Assessment & Plan Note (Signed)
On lisinopril and amlodipine. Will increased amlodipine to 5mg  bid. Follow pressures.  Follow metabolic panel.

## 2023-08-07 ENCOUNTER — Encounter: Payer: Self-pay | Admitting: Internal Medicine

## 2023-08-10 NOTE — Telephone Encounter (Signed)
I can place order for referral.  Just please clarify his symptoms - increased frequency? Hesitancy? Nocturia?

## 2023-08-10 NOTE — Telephone Encounter (Signed)
Per my chart message, this was discussed at last visit. Do you want to refer to urology for the OAB or schedule him an appt with you to be re-evaluated?

## 2023-08-13 NOTE — Telephone Encounter (Signed)
Pt scheduled with Dr Lorin Picket. ON abx right now and symptoms seem to be improving/

## 2023-08-31 ENCOUNTER — Ambulatory Visit: Payer: 59 | Admitting: Internal Medicine

## 2023-08-31 VITALS — BP 136/74 | HR 96 | Temp 98.0°F | Resp 16 | Ht 71.0 in | Wt 303.0 lb

## 2023-08-31 DIAGNOSIS — R739 Hyperglycemia, unspecified: Secondary | ICD-10-CM

## 2023-08-31 DIAGNOSIS — I10 Essential (primary) hypertension: Secondary | ICD-10-CM | POA: Diagnosis not present

## 2023-08-31 DIAGNOSIS — E7800 Pure hypercholesterolemia, unspecified: Secondary | ICD-10-CM

## 2023-08-31 DIAGNOSIS — G4730 Sleep apnea, unspecified: Secondary | ICD-10-CM | POA: Diagnosis not present

## 2023-08-31 DIAGNOSIS — R350 Frequency of micturition: Secondary | ICD-10-CM

## 2023-08-31 MED ORDER — AMLODIPINE BESYLATE 5 MG PO TABS: 5.0000 mg | ORAL_TABLET | Freq: Every day | ORAL | Status: DC

## 2023-08-31 MED ORDER — LISINOPRIL 40 MG PO TABS
40.0000 mg | ORAL_TABLET | Freq: Every day | ORAL | 1 refills | Status: DC
Start: 2023-08-31 — End: 2024-02-03

## 2023-08-31 NOTE — Progress Notes (Signed)
 Subjective:    Patient ID: Gary Fleming, male    DOB: August 25, 1981, 43 y.o.   MRN: [id number]  Patient here for  Chief Complaint  Patient presents with   Medical Management of Chronic Issues    HPI Work in appt. Was having persistent urinary issues. Was seen UC 08/08/23. Was treated for acute prostatitis with bactrim. Reports that his urinary symptoms have improved. Did not tolerate amlodipine  5mg  bid. Since decreasing to 5mg  q day, noticed improvement in urinary symptoms as well. Taking lisinopril . Overall feels better. No chest pain or sob reported. No abdominal pain or bowel change reported.    Past Medical History:  Diagnosis Date   Chest discomfort    Dyspnea on exertion    GERD (gastroesophageal reflux disease)    Hyperlipidemia    Hypertension    Morbid obesity (HCC)    Overactive bladder    History reviewed. No pertinent surgical history. Family History  Problem Relation Age of Onset   Prostate cancer Paternal Grandfather        MI @ 16.   Heart disease Paternal Grandfather    Prostate cancer Paternal Uncle    Lung cancer Maternal Grandmother    Hypertension Mother        currently in her 63's   Hypercholesterolemia Mother    Diabetes Mother    Hypertension Father        currently in his 43's   Hypercholesterolemia Father    Heart disease Maternal Grandfather        MI in his 39's   Colon cancer Neg Hx    Social History   Socioeconomic History   Marital status: Single    Spouse name: Not on file   Number of children: Not on file   Years of education: Not on file   Highest education level: Associate degree: occupational, scientist, product/process development, or vocational program  Occupational History   Occupation: Building Control Surveyor: [employer]  Tobacco Use   Smoking status: Never   Smokeless tobacco: Current    Types: Chew  Vaping Use   Vaping status: Never Used  Substance and Sexual Activity   Alcohol use: Yes    Alcohol/week: 0.0 standard  drinks of alcohol    Comment: three to four beers a month.   Drug use: No   Sexual activity: Not on file  Other Topics Concern   Not on file  Social History Narrative   Lives in [place] with family.  Single.  No children.  Does routinely exercise - wt training, treadmill.   Social Drivers of Corporate Investment Banker Strain: Low Risk  (08/31/2023)   Overall Financial Resource Strain (CARDIA)    Difficulty of Paying Living Expenses: Not hard at all  Food Insecurity: No Food Insecurity (08/31/2023)   Hunger Vital Sign    Worried About Running Out of Food in the Last Year: Never true    Ran Out of Food in the Last Year: Never true  Transportation Needs: No Transportation Needs (08/31/2023)   PRAPARE - Administrator, Civil Service (Medical): No    Lack of Transportation (Non-Medical): No  Physical Activity: Insufficiently Active (08/31/2023)   Exercise Vital Sign    Days of Exercise per Week: 3 days    Minutes of Exercise per Session: 30 min  Stress: No Stress Concern Present (08/31/2023)   [employer] of Occupational Health - Occupational Stress Questionnaire    Feeling of Stress :  Not at all  Social Connections: Socially Integrated (08/31/2023)   Social Connection and Isolation Panel [NHANES]    Frequency of Communication with Friends and Family: More than three times a week    Frequency of Social Gatherings with Friends and Family: Twice a week    Attends Religious Services: 1 to 4 times per year    Active Member of [employer] or Organizations: Yes    Attends Banker Meetings: 1 to 4 times per year    Marital Status: Living with partner     Review of Systems  Constitutional:  Negative for appetite change and unexpected weight change.  HENT:  Negative for congestion and sinus pressure.   Respiratory:  Negative for cough, chest tightness and shortness of breath.   Cardiovascular:  Negative for chest pain and palpitations.  Gastrointestinal:  Negative for  abdominal pain, diarrhea, nausea and vomiting.  Genitourinary:  Negative for difficulty urinating and dysuria.  Musculoskeletal:  Negative for joint swelling and myalgias.  Skin:  Negative for color change and rash.  Neurological:  Negative for dizziness and headaches.  Psychiatric/Behavioral:  Negative for agitation and dysphoric mood.        Objective:     BP 136/74   Pulse 96   Temp 98 F (36.7 C)   Resp 16   Ht 5' 11 (1.803 m)   Wt (!) 303 lb (137.4 kg)   SpO2 98%   BMI 42.26 kg/m  Wt Readings from Last 3 Encounters:  08/31/23 (!) 303 lb (137.4 kg)  07/17/23 (!) 308 lb (139.7 kg)  01/16/23 297 lb 6.4 oz (134.9 kg)    Physical Exam Vitals reviewed.  Constitutional:      General: He is not in acute distress.    Appearance: Normal appearance. He is well-developed.  HENT:     Head: Normocephalic and atraumatic.     Right Ear: External ear normal.     Left Ear: External ear normal.     Mouth/Throat:     Pharynx: No oropharyngeal exudate or posterior oropharyngeal erythema.  Eyes:     General: No scleral icterus.       Right eye: No discharge.        Left eye: No discharge.     Conjunctiva/sclera: Conjunctivae normal.  Cardiovascular:     Rate and Rhythm: Normal rate and regular rhythm.  Pulmonary:     Effort: Pulmonary effort is normal. No respiratory distress.     Breath sounds: Normal breath sounds.  Abdominal:     General: Bowel sounds are normal.     Palpations: Abdomen is soft.     Tenderness: There is no abdominal tenderness.  Musculoskeletal:        General: No swelling or tenderness.     Cervical back: Neck supple. No tenderness.  Lymphadenopathy:     Cervical: No cervical adenopathy.  Skin:    Findings: No erythema or rash.  Neurological:     Mental Status: He is alert.  Psychiatric:        Mood and Affect: Mood normal.        Behavior: Behavior normal.      Outpatient Encounter Medications as of 08/31/2023  Medication Sig   lisinopril   (ZESTRIL ) 40 MG tablet Take 1 tablet (40 mg total) by mouth daily.   amLODipine  (NORVASC ) 5 MG tablet Take 1 tablet (5 mg total) by mouth daily.   atorvastatin  (LIPITOR) 10 MG tablet Take 1 tablet (10 mg total) by mouth daily.  Azelastine -Fluticasone  137-50 MCG/ACT SUSP SHAKE LIQUID AND USE 1 SPRAY IN EACH NOSTRIL TWICE DAILY   LINZESS 145 MCG CAPS capsule Take 145 mcg by mouth once.   Multiple Vitamin (MULTIVITAMIN) tablet Take 1 tablet by mouth daily.   sildenafil  (REVATIO ) 20 MG tablet Take 2-3 tablets daily prn   [DISCONTINUED] amLODipine  (NORVASC ) 5 MG tablet Take 1 tablet (5 mg total) by mouth 2 (two) times daily. (Patient taking differently: Take 5 mg by mouth daily.)   [DISCONTINUED] lisinopril  (ZESTRIL ) 20 MG tablet Take 1 tablet (20 mg total) by mouth daily.   No facility-administered encounter medications on file as of 08/31/2023.     Lab Results  Component Value Date   WBC 9.4 10/17/2022   HGB 15.3 10/17/2022   HCT 45.4 10/17/2022   PLT 374.0 10/17/2022   GLUCOSE 109 (H) 02/13/2023   CHOL 171 02/13/2023   TRIG 126.0 02/13/2023   HDL 54.10 02/13/2023   LDLDIRECT 93.0 03/17/2022   LDLCALC 92 02/13/2023   ALT 39 02/13/2023   AST 22 02/13/2023   NA 136 02/13/2023   K 4.7 02/13/2023   CL 98 02/13/2023   CREATININE 0.84 02/13/2023   BUN 10 02/13/2023   CO2 31 02/13/2023   TSH 2.78 09/16/2022   PSA 0.19 03/17/2022   HGBA1C 6.0 02/13/2023    US  Abdomen Complete Result Date: 11/24/2021 CLINICAL DATA:  Abnormal LFTs EXAM: ABDOMEN ULTRASOUND COMPLETE COMPARISON:  02/03/2009 FINDINGS: Gallbladder: No gallstones or wall thickening visualized. No sonographic Murphy sign noted by sonographer. Common bile duct: Diameter: 3 mm Liver: No focal lesion identified. Within normal limits in parenchymal echogenicity. Portal vein is patent on color Doppler imaging with normal direction of blood flow towards the liver. IVC: No abnormality visualized. Pancreas: Visualized portion unremarkable.  Spleen: Size and appearance within normal limits. Right Kidney: Length: 12.9 cm. Echogenicity within normal limits. No mass or hydronephrosis visualized. Left Kidney: Length: 13.8 cm. Echogenicity within normal limits. No mass or hydronephrosis visualized. Abdominal aorta: No aneurysm visualized. Other findings: None. IMPRESSION: Normal abdominal ultrasound. Electronically Signed   By: Aliene Lloyd M.D.   On: 11/24/2021 18:20       Assessment & Plan:  Hypertension, essential Assessment & Plan: On lisinopril  and amlodipine . Now taking amlodipine  5mg  daily.  Currently on lisinopril  20mg  q day. Will increase to 40mg  q day. Discuss goal pressures. Follow pressures.  Follow metabolic panel.    Hyperglycemia Assessment & Plan: Low carb diet and exercise.  Follow met b and a1c.    Hypercholesterolemia Assessment & Plan: Continue lipitor.  Low cholesterol diet and exercise. [facility] lipid diet. Follow lipid panel and liver function tests.     Sleep apnea, unspecified type Assessment & Plan: Cpap.    Urinary frequency Assessment & Plan: Recently evaluated. Diagnosed with prostatitis. Treated with bactrim. Urinary symptoms improved. Also feel symptoms improved after decreasing amlodipine . Follow.    Other orders -     amLODIPine  Besylate; Take 1 tablet (5 mg total) by mouth daily. -     Lisinopril ; Take 1 tablet (40 mg total) by mouth daily.  Dispense: 90 tablet; Refill: 1     Allena Hamilton, MD

## 2023-09-05 ENCOUNTER — Encounter: Payer: Self-pay | Admitting: Internal Medicine

## 2023-09-05 DIAGNOSIS — R350 Frequency of micturition: Secondary | ICD-10-CM | POA: Insufficient documentation

## 2023-09-05 NOTE — Assessment & Plan Note (Signed)
 Recently evaluated. Diagnosed with prostatitis. Treated with bactrim. Urinary symptoms improved. Also feel symptoms improved after decreasing amlodipine. Follow.

## 2023-09-05 NOTE — Assessment & Plan Note (Signed)
 Continue lipitor.  Low cholesterol diet and exercise. [facility] lipid diet. Follow lipid panel and liver function tests.

## 2023-09-05 NOTE — Assessment & Plan Note (Signed)
 On lisinopril and amlodipine. Now taking amlodipine 5mg  daily.  Currently on lisinopril 20mg  q day. Will increase to 40mg  q day. Discuss goal pressures. Follow pressures.  Follow metabolic panel.

## 2023-09-05 NOTE — Assessment & Plan Note (Signed)
 Low carb diet and exercise.  Follow met b and a1c.

## 2023-09-05 NOTE — Assessment & Plan Note (Signed)
 Cpap

## 2023-09-09 ENCOUNTER — Ambulatory Visit: Payer: BC Managed Care – PPO | Admitting: Internal Medicine

## 2023-09-17 ENCOUNTER — Encounter: Payer: Self-pay | Admitting: Internal Medicine

## 2023-09-17 MED ORDER — ATORVASTATIN CALCIUM 10 MG PO TABS: 10.0000 mg | ORAL_TABLET | Freq: Every day | ORAL | 3 refills | Status: AC

## 2023-10-01 ENCOUNTER — Ambulatory Visit (INDEPENDENT_AMBULATORY_CARE_PROVIDER_SITE_OTHER): Payer: 59 | Admitting: Internal Medicine

## 2023-10-01 VITALS — BP 128/72 | HR 80 | Temp 97.9°F | Resp 16 | Ht 71.0 in | Wt 296.0 lb

## 2023-10-01 DIAGNOSIS — I10 Essential (primary) hypertension: Secondary | ICD-10-CM

## 2023-10-01 DIAGNOSIS — R739 Hyperglycemia, unspecified: Secondary | ICD-10-CM

## 2023-10-01 DIAGNOSIS — E7800 Pure hypercholesterolemia, unspecified: Secondary | ICD-10-CM

## 2023-10-01 DIAGNOSIS — G4730 Sleep apnea, unspecified: Secondary | ICD-10-CM

## 2023-10-01 DIAGNOSIS — R350 Frequency of micturition: Secondary | ICD-10-CM

## 2023-10-01 DIAGNOSIS — D75839 Thrombocytosis, unspecified: Secondary | ICD-10-CM

## 2023-10-01 DIAGNOSIS — Z0000 Encounter for general adult medical examination without abnormal findings: Secondary | ICD-10-CM | POA: Diagnosis not present

## 2023-10-01 DIAGNOSIS — Z125 Encounter for screening for malignant neoplasm of prostate: Secondary | ICD-10-CM | POA: Diagnosis not present

## 2023-10-01 DIAGNOSIS — K219 Gastro-esophageal reflux disease without esophagitis: Secondary | ICD-10-CM

## 2023-10-01 NOTE — Assessment & Plan Note (Addendum)
 Physical today 10/01/23.  PSA 03/17/22 - .19. Due psa check. Recheck today.

## 2023-10-01 NOTE — Progress Notes (Signed)
 Subjective:    Patient ID: Gary Fleming, male    DOB: 04-18-1981, 43 y.o.   MRN: [id number]  Patient here for  Chief Complaint  Patient presents with   Annual Exam    HPI Here for a physical exam. Recently evaluated - f/u blood pressure. Did not tolerate amlodipine  5mg  bid. Lisinopril  was increased to 40mg  q day. Continues cpap. Blood pressure doing better. Tolerating above regimen. Still with some intermittent increased urinary frequency. May occur 2x/week. Discussed possible prostate issues and emptying bladder fully. Breathing stable. Blood pressures averaging 120s/70s.    Past Medical History:  Diagnosis Date   Chest discomfort    Dyspnea on exertion    GERD (gastroesophageal reflux disease)    Hyperlipidemia    Hypertension    Morbid obesity (HCC)    Overactive bladder    History reviewed. No pertinent surgical history. Family History  Problem Relation Age of Onset   Prostate cancer Paternal Grandfather        MI @ 65.   Heart disease Paternal Grandfather    Prostate cancer Paternal Uncle    Lung cancer Maternal Grandmother    Hypertension Mother        currently in her 55's   Hypercholesterolemia Mother    Diabetes Mother    Hypertension Father        currently in his 63's   Hypercholesterolemia Father    Heart disease Maternal Grandfather        MI in his 55's   Colon cancer Neg Hx    Social History   Socioeconomic History   Marital status: Single    Spouse name: Not on file   Number of children: Not on file   Years of education: Not on file   Highest education level: Associate degree: occupational, scientist, product/process development, or vocational program  Occupational History   Occupation: Building Control Surveyor: [employer]  Tobacco Use   Smoking status: Never   Smokeless tobacco: Current    Types: Chew  Vaping Use   Vaping status: Never Used  Substance and Sexual Activity   Alcohol use: Yes    Alcohol/week: 0.0 standard drinks of alcohol     Comment: three to four beers a month.   Drug use: No   Sexual activity: Not on file  Other Topics Concern   Not on file  Social History Narrative   Lives in [place] with family.  Single.  No children.  Does routinely exercise - wt training, treadmill.   Social Drivers of Corporate Investment Banker Strain: Low Risk  (08/31/2023)   Overall Financial Resource Strain (CARDIA)    Difficulty of Paying Living Expenses: Not hard at all  Food Insecurity: No Food Insecurity (08/31/2023)   Hunger Vital Sign    Worried About Running Out of Food in the Last Year: Never true    Ran Out of Food in the Last Year: Never true  Transportation Needs: No Transportation Needs (08/31/2023)   PRAPARE - Administrator, Civil Service (Medical): No    Lack of Transportation (Non-Medical): No  Physical Activity: Insufficiently Active (08/31/2023)   Exercise Vital Sign    Days of Exercise per Week: 3 days    Minutes of Exercise per Session: 30 min  Stress: No Stress Concern Present (08/31/2023)   [employer] of Occupational Health - Occupational Stress Questionnaire    Feeling of Stress : Not at all  Social Connections: Socially Integrated (  08/31/2023)   Social Connection and Isolation Panel [NHANES]    Frequency of Communication with Friends and Family: More than three times a week    Frequency of Social Gatherings with Friends and Family: Twice a week    Attends Religious Services: 1 to 4 times per year    Active Member of [employer] or Organizations: Yes    Attends Banker Meetings: 1 to 4 times per year    Marital Status: Living with partner     Review of Systems  Constitutional:  Negative for appetite change and unexpected weight change.  HENT:  Negative for congestion and sinus pressure.   Respiratory:  Negative for cough, chest tightness and shortness of breath.   Cardiovascular:  Negative for chest pain and palpitations.  Gastrointestinal:  Negative for abdominal pain,  diarrhea, nausea and vomiting.  Genitourinary:  Positive for frequency.  Musculoskeletal:  Negative for joint swelling and myalgias.  Skin:  Negative for color change and rash.  Neurological:  Negative for dizziness and headaches.  Psychiatric/Behavioral:  Negative for agitation and dysphoric mood.        Objective:     BP 128/72   Pulse 80   Temp 97.9 F (36.6 C)   Resp 16   Ht 5' 11 (1.803 m)   Wt 296 lb (134.3 kg)   SpO2 98%   BMI 41.28 kg/m  Wt Readings from Last 3 Encounters:  10/01/23 296 lb (134.3 kg)  08/31/23 (!) 303 lb (137.4 kg)  07/17/23 (!) 308 lb (139.7 kg)    Physical Exam Constitutional:      General: He is not in acute distress.    Appearance: Normal appearance. He is well-developed.  HENT:     Head: Normocephalic and atraumatic.     Right Ear: External ear normal.     Left Ear: External ear normal.     Mouth/Throat:     Pharynx: No oropharyngeal exudate or posterior oropharyngeal erythema.  Eyes:     General: No scleral icterus.       Right eye: No discharge.        Left eye: No discharge.     Conjunctiva/sclera: Conjunctivae normal.  Neck:     Thyroid : No thyromegaly.  Cardiovascular:     Rate and Rhythm: Normal rate and regular rhythm.  Pulmonary:     Effort: No respiratory distress.     Breath sounds: Normal breath sounds. No wheezing.  Abdominal:     General: Bowel sounds are normal.     Palpations: Abdomen is soft.     Tenderness: There is no abdominal tenderness.  Genitourinary:    Comments: Rectal exam - prostate felt to be enlarged - no palpable nodule  Musculoskeletal:        General: No swelling or tenderness.     Cervical back: Neck supple. No tenderness.  Lymphadenopathy:     Cervical: No cervical adenopathy.  Skin:    Findings: No erythema or rash.  Neurological:     Mental Status: He is alert and oriented to person, place, and time.  Psychiatric:        Mood and Affect: Mood normal.        Behavior: Behavior normal.          Outpatient Encounter Medications as of 10/01/2023  Medication Sig   LINZESS 72 MCG capsule Take 72 mcg by mouth daily before breakfast.   amLODipine  (NORVASC ) 5 MG tablet Take 1 tablet (5 mg total) by mouth daily.  atorvastatin  (LIPITOR) 10 MG tablet Take 1 tablet (10 mg total) by mouth daily.   Azelastine -Fluticasone  137-50 MCG/ACT SUSP SHAKE LIQUID AND USE 1 SPRAY IN EACH NOSTRIL TWICE DAILY   lisinopril  (ZESTRIL ) 40 MG tablet Take 1 tablet (40 mg total) by mouth daily.   Multiple Vitamin (MULTIVITAMIN) tablet Take 1 tablet by mouth daily.   sildenafil  (REVATIO ) 20 MG tablet Take 2-3 tablets daily prn   [DISCONTINUED] LINZESS 145 MCG CAPS capsule Take 145 mcg by mouth once.   No facility-administered encounter medications on file as of 10/01/2023.     Lab Results  Component Value Date   WBC 9.8 10/01/2023   HGB 14.9 10/01/2023   HCT 44.7 10/01/2023   PLT 396.0 10/01/2023   GLUCOSE 90 10/01/2023   CHOL 187 10/01/2023   TRIG 76.0 10/01/2023   HDL 58.10 10/01/2023   LDLDIRECT 93.0 03/17/2022   LDLCALC 114 (H) 10/01/2023   ALT 38 10/01/2023   AST 23 10/01/2023   NA 140 10/01/2023   K 4.3 10/01/2023   CL 99 10/01/2023   CREATININE 0.68 10/01/2023   BUN 13 10/01/2023   CO2 26 10/01/2023   TSH 2.21 10/01/2023   PSA 0.33 10/01/2023   HGBA1C 6.0 10/01/2023    US  Abdomen Complete Result Date: 11/24/2021 CLINICAL DATA:  Abnormal LFTs EXAM: ABDOMEN ULTRASOUND COMPLETE COMPARISON:  02/03/2009 FINDINGS: Gallbladder: No gallstones or wall thickening visualized. No sonographic Murphy sign noted by sonographer. Common bile duct: Diameter: 3 mm Liver: No focal lesion identified. Within normal limits in parenchymal echogenicity. Portal vein is patent on color Doppler imaging with normal direction of blood flow towards the liver. IVC: No abnormality visualized. Pancreas: Visualized portion unremarkable. Spleen: Size and appearance within normal limits. Right Kidney: Length: 12.9 cm.  Echogenicity within normal limits. No mass or hydronephrosis visualized. Left Kidney: Length: 13.8 cm. Echogenicity within normal limits. No mass or hydronephrosis visualized. Abdominal aorta: No aneurysm visualized. Other findings: None. IMPRESSION: Normal abdominal ultrasound. Electronically Signed   By: Aliene Lloyd M.D.   On: 11/24/2021 18:20       Assessment & Plan:  Health care maintenance Assessment & Plan: Physical today 10/01/23.  PSA 03/17/22 - .19. Due psa check. Recheck today.    Prostate cancer screening -     PSA  Hyperglycemia Assessment & Plan: Low carb diet and exercise.  Follow met b and a1c.   Orders: -     Hemoglobin A1c  Hypercholesterolemia Assessment & Plan: Continue lipitor.  Low cholesterol diet and exercise. Follow lipid panel and liver function tests.    Orders: -     TSH -     Lipid panel -     Hepatic function panel -     CBC with Differential/Platelet  Hypertension, essential Assessment & Plan: Doing well on lisinopril  40mg  q day. Follow pressures.  Follow metabolic panel.   Orders: -     Basic metabolic panel  Urinary frequency Assessment & Plan: Recently evaluated. Diagnosed with prostatitis. Treated with bactrim. Urinary symptoms improved. Overall better. Still with some increased frequency. Discussed prostate issues. Discussed urology referral for further evaluation.  Agreeable. Prefers urologist in [place].    Thrombocytosis Assessment & Plan: Last check wnl. Recheck cbc to confirm remaining wnl.    Sleep apnea, unspecified type Assessment & Plan: Cpap.    Gastroesophageal reflux disease, unspecified whether esophagitis present Assessment & Plan: No acid reflux reported.        Allena Hamilton, MD

## 2023-10-02 LAB — LIPID PANEL
Cholesterol: 187 mg/dL (ref 0–200)
HDL: 58.1 mg/dL (ref >39.00)
LDL Cholesterol: 114 mg/dL — ABNORMAL HIGH (ref 0–99)
NonHDL: 129.29
Total CHOL/HDL Ratio: 3
Triglycerides: 76 mg/dL (ref 0.0–149.0)
VLDL: 15.2 mg/dL (ref 0.0–40.0)

## 2023-10-02 LAB — CBC WITH DIFFERENTIAL/PLATELET
Basophils Absolute: 0.1 10*3/uL (ref 0.0–0.1)
Basophils Relative: 0.8 % (ref 0.0–3.0)
Eosinophils Absolute: 0.4 10*3/uL (ref 0.0–0.7)
Eosinophils Relative: 3.8 % (ref 0.0–5.0)
HCT: 44.7 % (ref 39.0–52.0)
Hemoglobin: 14.9 g/dL (ref 13.0–17.0)
Lymphocytes Relative: 21.3 % (ref 12.0–46.0)
Lymphs Abs: 2.1 10*3/uL (ref 0.7–4.0)
MCHC: 33.4 g/dL (ref 30.0–36.0)
MCV: 88.9 fL (ref 78.0–100.0)
Monocytes Absolute: 0.9 10*3/uL (ref 0.1–1.0)
Monocytes Relative: 9.7 % (ref 3.0–12.0)
Neutro Abs: 6.3 10*3/uL (ref 1.4–7.7)
Neutrophils Relative %: 64.4 % (ref 43.0–77.0)
Platelets: 396 10*3/uL (ref 150.0–400.0)
RBC: 5.03 Mil/uL (ref 4.22–5.81)
RDW: 12.9 % (ref 11.5–15.5)
WBC: 9.8 10*3/uL (ref 4.0–10.5)

## 2023-10-02 LAB — BASIC METABOLIC PANEL
BUN: 13 mg/dL (ref 6–23)
CO2: 26 meq/L (ref 19–32)
Calcium: 9.4 mg/dL (ref 8.4–10.5)
Chloride: 99 meq/L (ref 96–112)
Creatinine, Ser: 0.68 mg/dL (ref 0.40–1.50)
GFR: 114.75 mL/min (ref >60.00)
Glucose, Bld: 90 mg/dL (ref 70–99)
Potassium: 4.3 meq/L (ref 3.5–5.1)
Sodium: 140 meq/L (ref 135–145)

## 2023-10-02 LAB — HEPATIC FUNCTION PANEL
ALT: 38 U/L (ref 0–53)
AST: 23 U/L (ref 0–37)
Albumin: 4.9 g/dL (ref 3.5–5.2)
Alkaline Phosphatase: 95 U/L (ref 39–117)
Bilirubin, Direct: 0.1 mg/dL (ref 0.0–0.3)
Total Bilirubin: 0.6 mg/dL (ref 0.2–1.2)
Total Protein: 7.5 g/dL (ref 6.0–8.3)

## 2023-10-02 LAB — TSH: TSH: 2.21 u[IU]/mL (ref 0.35–5.50)

## 2023-10-02 LAB — HEMOGLOBIN A1C: Hgb A1c MFr Bld: 6 % (ref 4.6–6.5)

## 2023-10-02 LAB — PSA: PSA: 0.33 ng/mL (ref 0.10–4.00)

## 2023-10-04 ENCOUNTER — Encounter: Payer: Self-pay | Admitting: Internal Medicine

## 2023-10-04 NOTE — Assessment & Plan Note (Signed)
 Doing well on lisinopril  40mg  q day. Follow pressures.  Follow metabolic panel.

## 2023-10-04 NOTE — Assessment & Plan Note (Addendum)
 Continue lipitor.  Low cholesterol diet and exercise.  Follow lipid panel and liver function tests.

## 2023-10-04 NOTE — Assessment & Plan Note (Signed)
 Last check wnl. Recheck cbc to confirm remaining wnl.

## 2023-10-04 NOTE — Assessment & Plan Note (Signed)
 Recently evaluated. Diagnosed with prostatitis. Treated with bactrim. Urinary symptoms improved. Overall better. Still with some increased frequency. Discussed prostate issues. Discussed urology referral for further evaluation.  Agreeable. Prefers urologist in [place].

## 2023-10-04 NOTE — Assessment & Plan Note (Addendum)
No acid reflux reported.

## 2023-10-04 NOTE — Assessment & Plan Note (Signed)
 Low carb diet and exercise.  Follow met b and a1c.

## 2023-10-04 NOTE — Assessment & Plan Note (Signed)
 Cpap

## 2023-10-08 ENCOUNTER — Telehealth: Payer: Self-pay | Admitting: Internal Medicine

## 2023-10-08 DIAGNOSIS — R350 Frequency of micturition: Secondary | ICD-10-CM

## 2023-10-08 NOTE — Telephone Encounter (Signed)
Message sent to [facility] with question about urology referral.

## 2023-10-08 NOTE — Telephone Encounter (Signed)
Order placed for urology referral.

## 2023-10-08 NOTE — Addendum Note (Signed)
Addended by: Charm Barges on: 10/08/2023 07:35 PM   Modules accepted: Orders

## 2023-12-17 ENCOUNTER — Encounter: Payer: Self-pay | Admitting: Internal Medicine

## 2023-12-17 DIAGNOSIS — N401 Enlarged prostate with lower urinary tract symptoms: Secondary | ICD-10-CM | POA: Insufficient documentation

## 2024-01-15 IMAGING — US US ABDOMEN COMPLETE
1 series · 15 of 25 positions shown · non-contrast
Comparison: 02/03/2009

CLINICAL DATA: Abnormal LFTs

EXAM:
ABDOMEN ULTRASOUND COMPLETE

[Series 1: us abdomen complete · 15 of 170 slices shown]
[im 1/170]
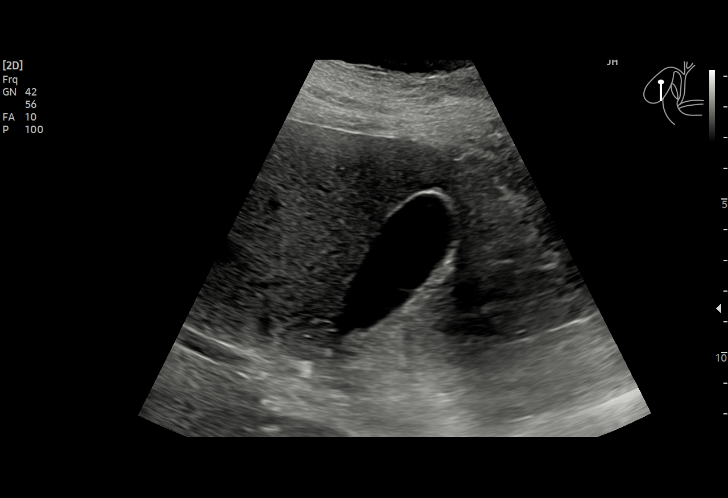
[im 15/170]
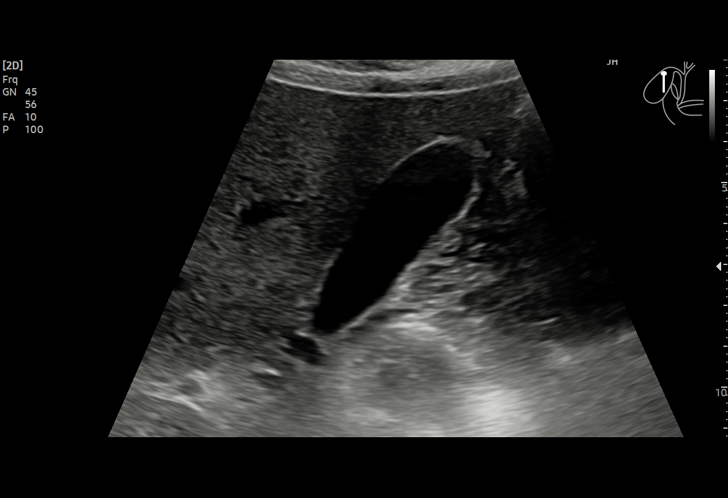
[im 29/170]
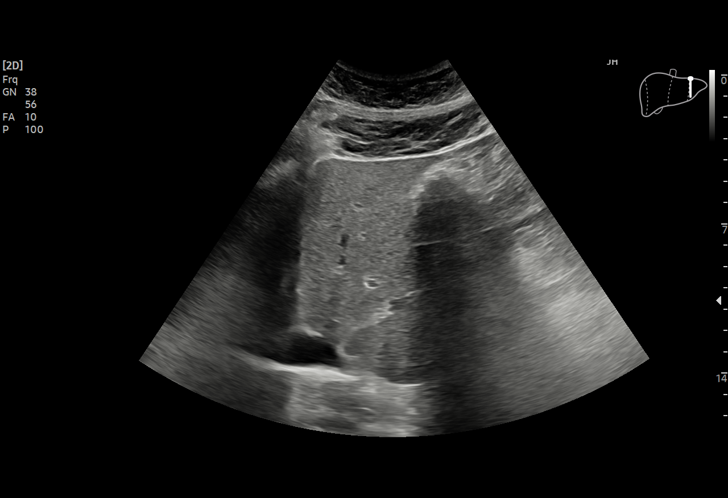
[im 36/170]
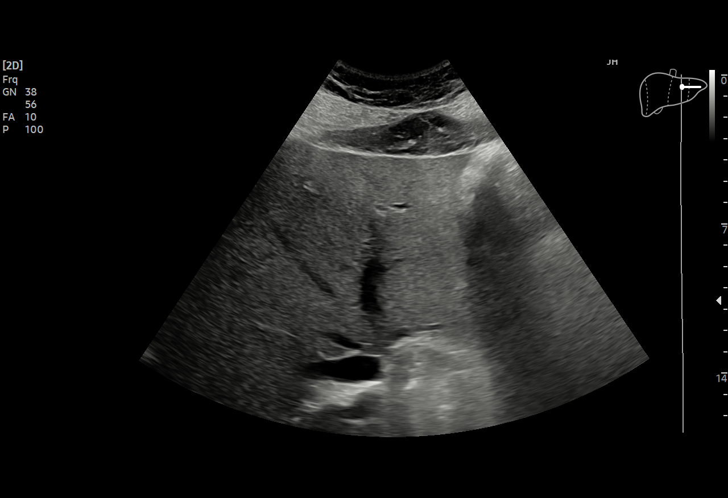
[im 50/170]
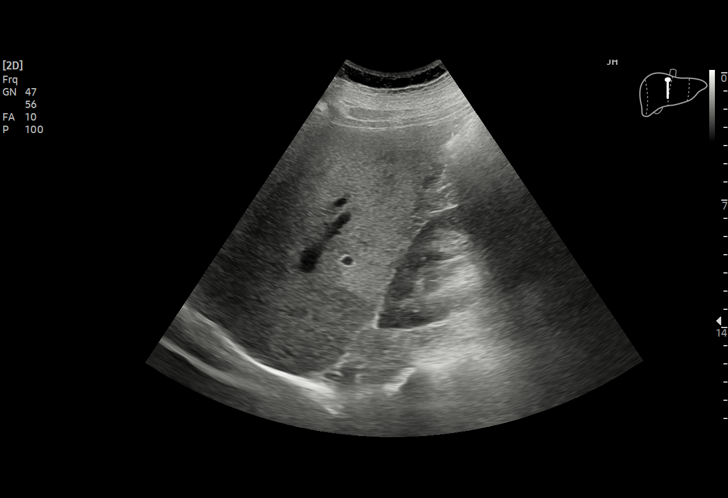
[im 64/170]
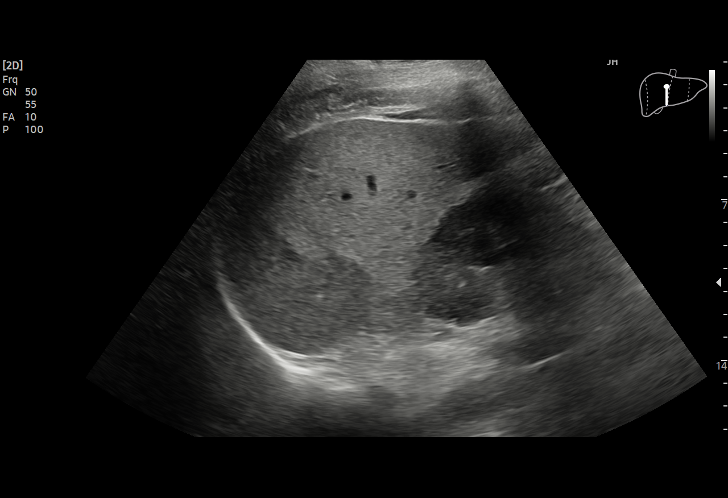
[im 71/170]
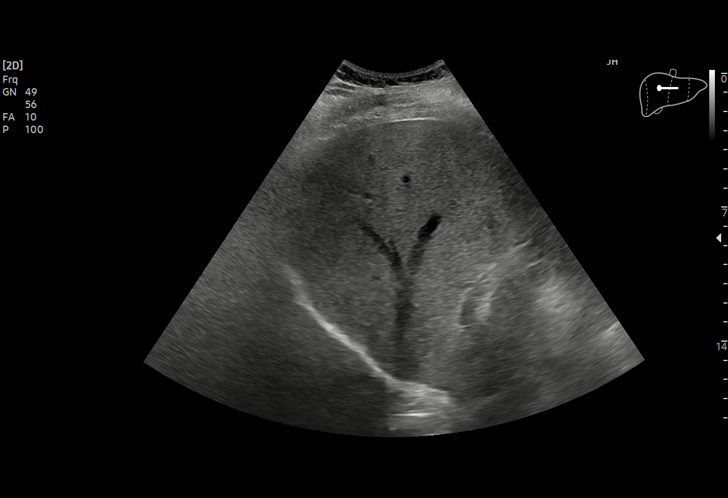
[im 85/170]
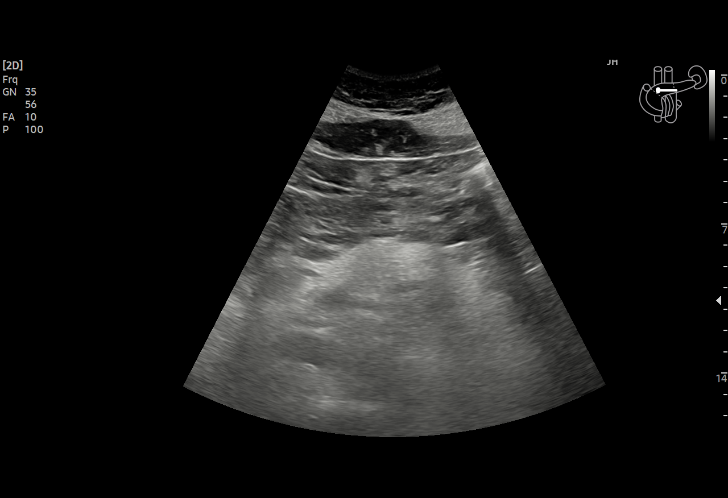
[im 99/170]
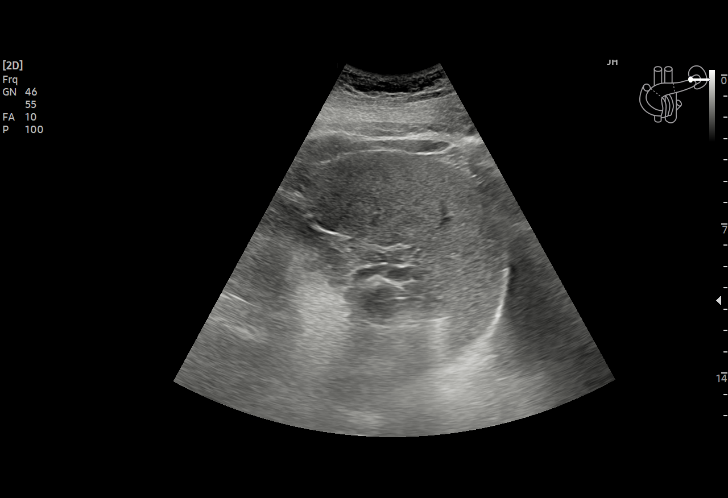
[im 106/170]
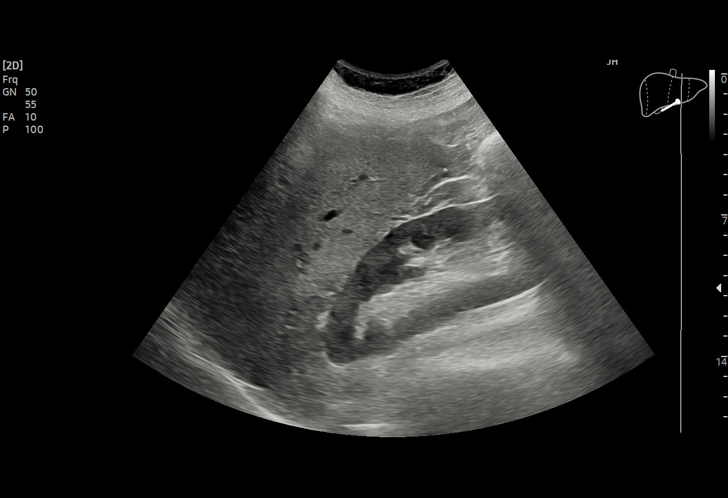
[im 120/170]
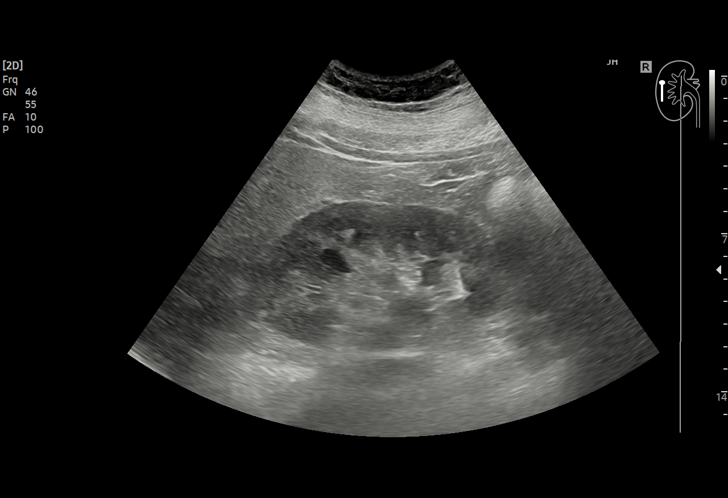
[im 134/170]
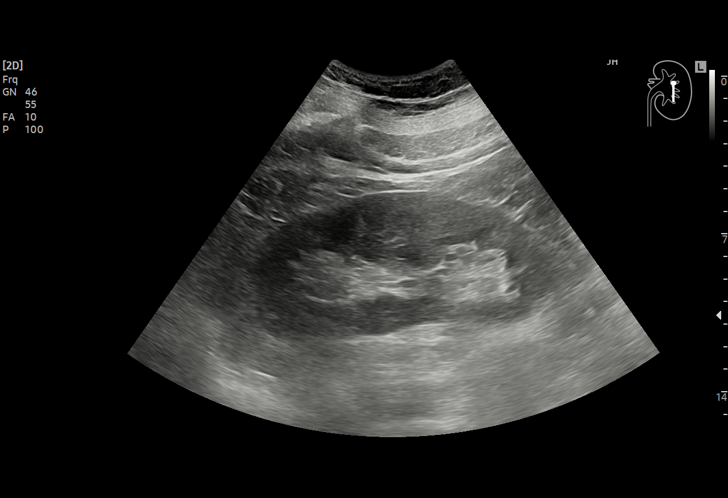
[im 141/170]
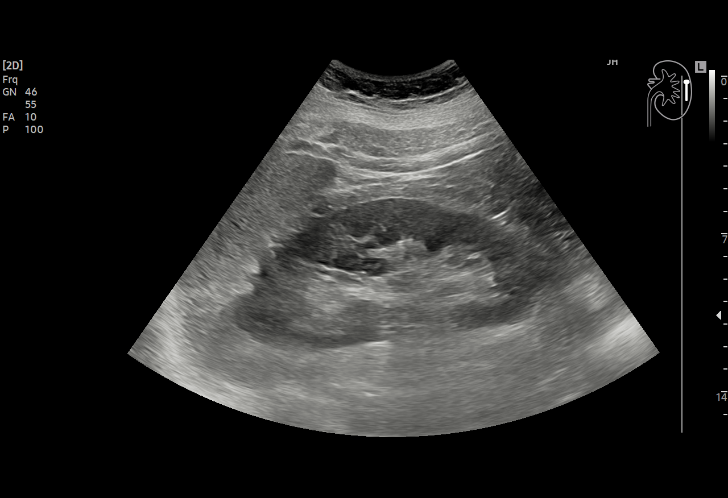
[im 155/170]
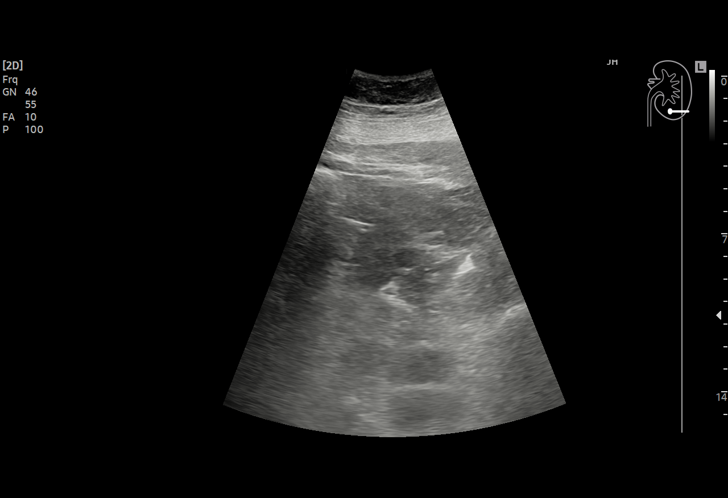
[im 170/170]
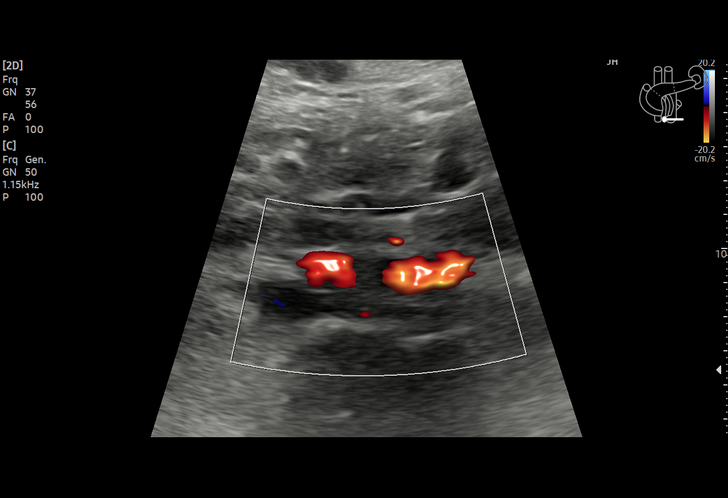

[15 of 25 positions shown; findings below may reference images not displayed]

FINDINGS: Gallbladder: No gallstones or wall thickening visualized. No
sonographic Murphy sign noted by sonographer.

Common bile duct: Diameter: 3 mm

Liver: No focal lesion identified. Within normal limits in
parenchymal echogenicity. Portal vein is patent on color Doppler
imaging with normal direction of blood flow towards the liver.

IVC: No abnormality visualized.

Pancreas: Visualized portion unremarkable.

Spleen: Size and appearance within normal limits.

Right Kidney: Length: 12.9 cm. Echogenicity within normal limits. No
mass or hydronephrosis visualized.

Left Kidney: Length: 13.8 cm. Echogenicity within normal limits. No
mass or hydronephrosis visualized.

Abdominal aorta: No aneurysm visualized.

Other findings: None.
IMPRESSION: Normal abdominal ultrasound.

## 2024-02-04 ENCOUNTER — Encounter: Payer: Self-pay | Admitting: Internal Medicine

## 2024-02-04 ENCOUNTER — Ambulatory Visit (INDEPENDENT_AMBULATORY_CARE_PROVIDER_SITE_OTHER): Payer: 59 | Admitting: Internal Medicine

## 2024-02-04 VITALS — BP 120/74 | HR 103 | Temp 97.9°F | Resp 16 | Ht 71.0 in | Wt 292.0 lb

## 2024-02-04 DIAGNOSIS — N401 Enlarged prostate with lower urinary tract symptoms: Secondary | ICD-10-CM

## 2024-02-04 DIAGNOSIS — I10 Essential (primary) hypertension: Secondary | ICD-10-CM | POA: Diagnosis not present

## 2024-02-04 DIAGNOSIS — R0981 Nasal congestion: Secondary | ICD-10-CM

## 2024-02-04 DIAGNOSIS — E7800 Pure hypercholesterolemia, unspecified: Secondary | ICD-10-CM | POA: Diagnosis not present

## 2024-02-04 DIAGNOSIS — R739 Hyperglycemia, unspecified: Secondary | ICD-10-CM | POA: Diagnosis not present

## 2024-02-04 DIAGNOSIS — G4730 Sleep apnea, unspecified: Secondary | ICD-10-CM

## 2024-02-04 DIAGNOSIS — R000 Tachycardia, unspecified: Secondary | ICD-10-CM

## 2024-02-04 MED ORDER — LISINOPRIL 40 MG PO TABS: 40.0000 mg | ORAL_TABLET | Freq: Every day | ORAL | 1 refills | Status: AC

## 2024-02-04 MED ORDER — AZELASTINE-FLUTICASONE 137-50 MCG/ACT NA SUSP: NASAL | 3 refills | Status: AC

## 2024-02-04 MED ORDER — AMLODIPINE BESYLATE 5 MG PO TABS: 5.0000 mg | ORAL_TABLET | Freq: Every day | ORAL | 1 refills | Status: AC

## 2024-02-04 NOTE — Progress Notes (Signed)
 Subjective:    Patient ID: Gary Fleming, male    DOB: March 25, 1981, 43 y.o.   MRN: [id number]  Patient here for  Chief Complaint  Patient presents with   Medical Management of Chronic Issues    HPI Here for a scheduled follow up - follow up regarding his blood pressure. Did not tolerate amlodipine . Continues on lisinopril . Continues cpap. Saw urology 12/17/23 - benign prostatic hyperplasia with LUTS. Was given alfuzosin presumptively for future episodes of suspected prostatitis. Currently not having any issues. No chest pain or sob reported. Does report that he will notice at times - not being able to breath out of one nostril. Will be closed (congested).  When this occurs, he will notice his heart rate increases. Has been using afrin nasal spray, zyrtec and nasonex. Also saline nasal spray. No chest congestion or increased cough.    Past Medical History:  Diagnosis Date   Chest discomfort    Dyspnea on exertion    GERD (gastroesophageal reflux disease)    Hyperlipidemia    Hypertension    Morbid obesity (HCC)    Overactive bladder    History reviewed. No pertinent surgical history. Family History  Problem Relation Age of Onset   Prostate cancer Paternal Grandfather        MI @ 31.   Heart disease Paternal Grandfather    Prostate cancer Paternal Uncle    Lung cancer Maternal Grandmother    Hypertension Mother        currently in her 62's   Hypercholesterolemia Mother    Diabetes Mother    Hypertension Father        currently in his 19's   Hypercholesterolemia Father    Heart disease Maternal Grandfather        MI in his 26's   Colon cancer Neg Hx    Social History   Socioeconomic History   Marital status: Single    Spouse name: Not on file   Number of children: Not on file   Years of education: Not on file   Highest education level: Associate degree: occupational, Scientist, product/process development, or vocational program  Occupational History   Occupation: Clinical research associate: [employer]  Tobacco Use   Smoking status: Never   Smokeless tobacco: Current    Types: Chew  Vaping Use   Vaping status: Never Used  Substance and Sexual Activity   Alcohol use: Yes    Alcohol/week: 0.0 standard drinks of alcohol    Comment: three to four beers a month.   Drug use: No   Sexual activity: Not on file  Other Topics Concern   Not on file  Social History Narrative   Lives in [place] with family.  Single.  No children.  Does routinely exercise - wt training, treadmill.   Social Drivers of Corporate investment banker Strain: Low Risk  (08/31/2023)   Overall Financial Resource Strain (CARDIA)    Difficulty of Paying Living Expenses: Not hard at all  Food Insecurity: No Food Insecurity (08/31/2023)   Hunger Vital Sign    Worried About Running Out of Food in the Last Year: Never true    Ran Out of Food in the Last Year: Never true  Transportation Needs: No Transportation Needs (08/31/2023)   PRAPARE - Administrator, Civil Service (Medical): No    Lack of Transportation (Non-Medical): No  Physical Activity: Insufficiently Active (08/31/2023)   Exercise Vital Sign  Days of Exercise per Week: 3 days    Minutes of Exercise per Session: 30 min  Stress: No Stress Concern Present (08/31/2023)   [employer] of Occupational Health - Occupational Stress Questionnaire    Feeling of Stress : Not at all  Social Connections: Socially Integrated (08/31/2023)   Social Connection and Isolation Panel    Frequency of Communication with Friends and Family: More than three times a week    Frequency of Social Gatherings with Friends and Family: Twice a week    Attends Religious Services: 1 to 4 times per year    Active Member of [employer] or Organizations: Yes    Attends Banker Meetings: 1 to 4 times per year    Marital Status: Living with partner     Review of Systems  Constitutional:  Negative for appetite change and unexpected weight  change.  HENT:  Positive for congestion. Negative for sinus pressure.   Respiratory:  Negative for cough, chest tightness and shortness of breath.   Cardiovascular:  Negative for chest pain, palpitations and leg swelling.  Gastrointestinal:  Negative for abdominal pain, diarrhea, nausea and vomiting.  Genitourinary:  Negative for difficulty urinating and dysuria.  Musculoskeletal:  Negative for joint swelling and myalgias.  Skin:  Negative for color change and rash.  Neurological:  Negative for dizziness and headaches.  Psychiatric/Behavioral:  Negative for agitation and dysphoric mood.        Objective:     BP 120/74   Pulse (!) 103   Temp 97.9 F (36.6 C)   Resp 16   Ht 5' 11 (1.803 m)   Wt 292 lb (132.5 kg)   SpO2 98%   BMI 40.73 kg/m  Wt Readings from Last 3 Encounters:  02/04/24 292 lb (132.5 kg)  10/01/23 296 lb (134.3 kg)  08/31/23 (!) 303 lb (137.4 kg)    Physical Exam Vitals reviewed.  Constitutional:      General: He is not in acute distress.    Appearance: Normal appearance. He is well-developed.  HENT:     Head: Normocephalic and atraumatic.     Right Ear: External ear normal.     Left Ear: External ear normal.     Nose:     Comments: Erythematous turbinates.     Mouth/Throat:     Pharynx: No oropharyngeal exudate or posterior oropharyngeal erythema.   Eyes:     General: No scleral icterus.       Right eye: No discharge.        Left eye: No discharge.     Conjunctiva/sclera: Conjunctivae normal.    Cardiovascular:     Rate and Rhythm: Normal rate and regular rhythm.  Pulmonary:     Effort: Pulmonary effort is normal. No respiratory distress.     Breath sounds: Normal breath sounds.  Abdominal:     General: Bowel sounds are normal.     Palpations: Abdomen is soft.     Tenderness: There is no abdominal tenderness.   Musculoskeletal:        General: No swelling or tenderness.     Cervical back: Neck supple. No tenderness.  Lymphadenopathy:      Cervical: No cervical adenopathy.   Skin:    Findings: No erythema or rash.   Neurological:     Mental Status: He is alert.   Psychiatric:        Mood and Affect: Mood normal.        Behavior: Behavior normal.  Outpatient Encounter Medications as of 02/04/2024  Medication Sig   alfuzosin (UROXATRAL) 10 MG 24 hr tablet Take 10 mg by mouth daily.   atorvastatin  (LIPITOR) 10 MG tablet Take 1 tablet (10 mg total) by mouth daily.   LINZESS 72 MCG capsule Take 72 mcg by mouth daily before breakfast.   Multiple Vitamin (MULTIVITAMIN) tablet Take 1 tablet by mouth daily.   sildenafil  (REVATIO ) 20 MG tablet Take 2-3 tablets daily prn   [DISCONTINUED] Azelastine -Fluticasone  137-50 MCG/ACT SUSP SHAKE LIQUID AND USE 1 SPRAY IN EACH NOSTRIL TWICE DAILY   amLODipine  (NORVASC ) 5 MG tablet Take 1 tablet (5 mg total) by mouth daily.   Azelastine -Fluticasone  137-50 MCG/ACT SUSP SHAKE LIQUID AND USE 1 SPRAY IN EACH NOSTRIL TWICE DAILY   lisinopril  (ZESTRIL ) 40 MG tablet Take 1 tablet (40 mg total) by mouth daily.   [DISCONTINUED] amLODipine  (NORVASC ) 5 MG tablet Take 1 tablet (5 mg total) by mouth daily.   [DISCONTINUED] lisinopril  (ZESTRIL ) 40 MG tablet Take 1 tablet (40 mg total) by mouth daily.   No facility-administered encounter medications on file as of 02/04/2024.     Lab Results  Component Value Date   WBC 9.8 10/01/2023   HGB 14.9 10/01/2023   HCT 44.7 10/01/2023   PLT 396.0 10/01/2023   GLUCOSE 90 10/01/2023   CHOL 187 10/01/2023   TRIG 76.0 10/01/2023   HDL 58.10 10/01/2023   LDLDIRECT 93.0 03/17/2022   LDLCALC 114 (H) 10/01/2023   ALT 38 10/01/2023   AST 23 10/01/2023   NA 140 10/01/2023   K 4.3 10/01/2023   CL 99 10/01/2023   CREATININE 0.68 10/01/2023   BUN 13 10/01/2023   CO2 26 10/01/2023   TSH 2.21 10/01/2023   PSA 0.33 10/01/2023   HGBA1C 6.0 10/01/2023    US  Abdomen Complete Result Date: 11/24/2021 CLINICAL DATA:  Abnormal LFTs EXAM: ABDOMEN  ULTRASOUND COMPLETE COMPARISON:  02/03/2009 FINDINGS: Gallbladder: No gallstones or wall thickening visualized. No sonographic Murphy sign noted by sonographer. Common bile duct: Diameter: 3 mm Liver: No focal lesion identified. Within normal limits in parenchymal echogenicity. Portal vein is patent on color Doppler imaging with normal direction of blood flow towards the liver. IVC: No abnormality visualized. Pancreas: Visualized portion unremarkable. Spleen: Size and appearance within normal limits. Right Kidney: Length: 12.9 cm. Echogenicity within normal limits. No mass or hydronephrosis visualized. Left Kidney: Length: 13.8 cm. Echogenicity within normal limits. No mass or hydronephrosis visualized. Abdominal aorta: No aneurysm visualized. Other findings: None. IMPRESSION: Normal abdominal ultrasound. Electronically Signed   By: Elester Grim M.D.   On: 11/24/2021 18:20       Assessment & Plan:  Benign prostatic hyperplasia with lower urinary tract symptoms, symptom details unspecified Assessment & Plan: Saw urology 12/17/23 - benign prostatic hyperplasia with LUTS. Was given alfuzosin presumptively for future episodes of suspected prostatitis. Currently without symptoms.    Hypertension, essential Assessment & Plan: Blood pressure as outlined. On lisinopril . No changes in medication. Follow pressures. Follow metabolic panel.   Orders: -     Basic metabolic panel with GFR; Future  Hypercholesterolemia Assessment & Plan: Continue lipitor.  Low cholesterol diet and exercise. Follow lipid panel and liver function tests.   Orders: -     Hepatic function panel; Future -     Lipid panel; Future  Hyperglycemia Assessment & Plan: Low carb diet and exercise. Follow met b and A1c.   Orders: -     Hemoglobin A1c; Future  Nasal congestion Assessment &  Plan: Discussed his continued intermittent symptoms. Will stop zyrtec. Continue saline nasal spray. Stop afrin. Trial of dimista. Call with  update.    Sleep apnea, unspecified type Assessment & Plan: Continue cpap.    Tachycardia Assessment & Plan: Notices when is nose is stopped up. No chest pain. No sob. Treat the nasal congestion as outlined. Discussed further cardiac w/up, including EKG, zio monitor, etc. Desires to treat above. If persistent problems, will pursue further testing. Call with update.    Other orders -     amLODIPine  Besylate; Take 1 tablet (5 mg total) by mouth daily.  Dispense: 90 tablet; Refill: 1 -     Lisinopril ; Take 1 tablet (40 mg total) by mouth daily.  Dispense: 90 tablet; Refill: 1 -     Azelastine -Fluticasone ; SHAKE LIQUID AND USE 1 SPRAY IN EACH NOSTRIL TWICE DAILY  Dispense: 23 g; Refill: 3     Dellar Fenton, MD

## 2024-02-07 ENCOUNTER — Encounter: Payer: Self-pay | Admitting: Internal Medicine

## 2024-02-07 DIAGNOSIS — R000 Tachycardia, unspecified: Secondary | ICD-10-CM | POA: Insufficient documentation

## 2024-02-07 NOTE — Assessment & Plan Note (Signed)
 Continue lipitor.  Low cholesterol diet and exercise.  Follow lipid panel and liver function tests.

## 2024-02-07 NOTE — Assessment & Plan Note (Signed)
 Low-carb diet and exercise.  Follow met b and A1c.

## 2024-02-07 NOTE — Assessment & Plan Note (Signed)
 Notices when is nose is stopped up. No chest pain. No sob. Treat the nasal congestion as outlined. Discussed further cardiac w/up, including EKG, zio monitor, etc. Desires to treat above. If persistent problems, will pursue further testing. Call with update.

## 2024-02-07 NOTE — Assessment & Plan Note (Signed)
 Blood pressure as outlined. On lisinopril . No changes in medication. Follow pressures. Follow metabolic panel.

## 2024-02-07 NOTE — Assessment & Plan Note (Signed)
 Saw urology 12/17/23 - benign prostatic hyperplasia with LUTS. Was given alfuzosin presumptively for future episodes of suspected prostatitis. Currently without symptoms.

## 2024-02-07 NOTE — Assessment & Plan Note (Signed)
 Discussed his continued intermittent symptoms. Will stop zyrtec. Continue saline nasal spray. Stop afrin. Trial of dimista. Call with update.

## 2024-02-07 NOTE — Assessment & Plan Note (Signed)
 Continue cpap.

## 2024-02-12 ENCOUNTER — Telehealth: Payer: Self-pay | Admitting: Internal Medicine

## 2024-02-12 NOTE — Telephone Encounter (Signed)
 Doctor Geralyn Knee will be out of the office the day of your scheduled visit, 04/08/24. Please call the office at [phone] and we will get you rescheduled.   Thank you  [id number], please reschedule this pt when they call back. [facility]

## 2024-04-08 ENCOUNTER — Ambulatory Visit: Admitting: Internal Medicine

## 2024-09-30 ENCOUNTER — Encounter: Payer: Self-pay | Admitting: Internal Medicine

## 2024-11-28 ENCOUNTER — Encounter: Admitting: Internal Medicine
